# Patient Record
Sex: Male | Born: 1970 | Race: White | Hispanic: No | Smoking: Current every day smoker
Health system: Southern US, Community
[De-identification: ages and names within clinical notes are randomized; demographics above are authoritative.]

## PROBLEM LIST (undated history)

## (undated) DIAGNOSIS — D649 Anemia, unspecified: Secondary | ICD-10-CM

## (undated) DIAGNOSIS — M549 Dorsalgia, unspecified: Secondary | ICD-10-CM

## (undated) DIAGNOSIS — R569 Unspecified convulsions: Secondary | ICD-10-CM

## (undated) DIAGNOSIS — G8929 Other chronic pain: Secondary | ICD-10-CM

## (undated) DIAGNOSIS — I10 Essential (primary) hypertension: Secondary | ICD-10-CM

## (undated) DIAGNOSIS — K7460 Unspecified cirrhosis of liver: Secondary | ICD-10-CM

## (undated) HISTORY — PX: NO PAST SURGERIES: SHX2092

---

## 2005-07-22 ENCOUNTER — Emergency Department: Payer: Self-pay | Admitting: Unknown Physician Specialty

## 2005-08-24 ENCOUNTER — Emergency Department: Payer: Self-pay | Admitting: Emergency Medicine

## 2009-06-13 ENCOUNTER — Emergency Department: Payer: Self-pay | Admitting: Emergency Medicine

## 2010-10-31 ENCOUNTER — Emergency Department: Payer: Self-pay | Admitting: Unknown Physician Specialty

## 2010-11-27 ENCOUNTER — Inpatient Hospital Stay: Payer: Self-pay | Admitting: Psychiatry

## 2011-02-16 ENCOUNTER — Emergency Department: Payer: Self-pay | Admitting: Emergency Medicine

## 2011-11-01 ENCOUNTER — Emergency Department: Payer: Self-pay | Admitting: *Deleted

## 2011-12-02 ENCOUNTER — Emergency Department: Payer: Self-pay | Admitting: Emergency Medicine

## 2012-08-08 ENCOUNTER — Emergency Department: Payer: Self-pay | Admitting: Emergency Medicine

## 2012-08-08 LAB — CBC
HCT: 43.4 % (ref 40.0–52.0)
HGB: 14.9 g/dL (ref 13.0–18.0)
MCH: 34.3 pg — ABNORMAL HIGH (ref 26.0–34.0)
MCHC: 34.3 g/dL (ref 32.0–36.0)
MCV: 100 fL (ref 80–100)
RDW: 14.1 % (ref 11.5–14.5)

## 2012-08-08 LAB — COMPREHENSIVE METABOLIC PANEL
BUN: 6 mg/dL — ABNORMAL LOW (ref 7–18)
Chloride: 101 mmol/L (ref 98–107)
EGFR (African American): >60
EGFR (Non-African Amer.): >60
SGOT(AST): 203 U/L — ABNORMAL HIGH (ref 15–37)
SGPT (ALT): 106 U/L — ABNORMAL HIGH (ref 12–78)
Total Protein: 8.2 g/dL (ref 6.4–8.2)

## 2012-08-08 LAB — ETHANOL: Ethanol: <3 mg/dL

## 2012-08-08 LAB — MAGNESIUM: Magnesium: 1.5 mg/dL — ABNORMAL LOW

## 2013-02-03 ENCOUNTER — Encounter (HOSPITAL_COMMUNITY): Payer: Self-pay

## 2013-02-03 ENCOUNTER — Emergency Department: Payer: Self-pay | Admitting: Emergency Medicine

## 2013-02-03 ENCOUNTER — Inpatient Hospital Stay (HOSPITAL_COMMUNITY)
Admission: AD | Admit: 2013-02-03 | Discharge: 2013-02-08 | DRG: 086 | Disposition: A | Discharge status: Home / Self Care | Payer: Medicaid Other | Source: Other Acute Inpatient Hospital | Attending: Neurosurgery | Admitting: Neurosurgery

## 2013-02-03 DIAGNOSIS — G40909 Epilepsy, unspecified, not intractable, without status epilepticus: Secondary | ICD-10-CM | POA: Diagnosis present

## 2013-02-03 DIAGNOSIS — W19XXXA Unspecified fall, initial encounter: Secondary | ICD-10-CM | POA: Diagnosis present

## 2013-02-03 DIAGNOSIS — S06330A Contusion and laceration of cerebrum, unspecified, without loss of consciousness, initial encounter: Principal | ICD-10-CM | POA: Diagnosis present

## 2013-02-03 DIAGNOSIS — Y9229 Other specified public building as the place of occurrence of the external cause: Secondary | ICD-10-CM

## 2013-02-03 DIAGNOSIS — S065X9A Traumatic subdural hemorrhage with loss of consciousness of unspecified duration, initial encounter: Secondary | ICD-10-CM

## 2013-02-03 DIAGNOSIS — F17200 Nicotine dependence, unspecified, uncomplicated: Secondary | ICD-10-CM | POA: Diagnosis present

## 2013-02-03 DIAGNOSIS — R4789 Other speech disturbances: Secondary | ICD-10-CM | POA: Diagnosis present

## 2013-02-03 DIAGNOSIS — I10 Essential (primary) hypertension: Secondary | ICD-10-CM | POA: Diagnosis present

## 2013-02-03 DIAGNOSIS — E876 Hypokalemia: Secondary | ICD-10-CM | POA: Diagnosis not present

## 2013-02-03 DIAGNOSIS — E236 Other disorders of pituitary gland: Secondary | ICD-10-CM | POA: Diagnosis not present

## 2013-02-03 DIAGNOSIS — E871 Hypo-osmolality and hyponatremia: Secondary | ICD-10-CM

## 2013-02-03 HISTORY — DX: Essential (primary) hypertension: I10

## 2013-02-03 HISTORY — DX: Unspecified convulsions: R56.9

## 2013-02-03 LAB — CBC WITH DIFFERENTIAL/PLATELET
Basophil #: 0.2 10*3/uL — ABNORMAL HIGH (ref 0.0–0.1)
Eosinophil #: 0 10*3/uL (ref 0.0–0.7)
Lymphocyte #: 0.5 10*3/uL — ABNORMAL LOW (ref 1.0–3.6)
Lymphocyte %: 7.2 %
MCV: 96 fL (ref 80–100)
Monocyte #: 1 x10 3/mm (ref 0.2–1.0)
Platelet: 176 10*3/uL (ref 150–440)
RBC: 4.31 10*6/uL — ABNORMAL LOW (ref 4.40–5.90)
WBC: 7.4 10*3/uL (ref 3.8–10.6)

## 2013-02-03 LAB — BASIC METABOLIC PANEL
Anion Gap: 11 (ref 7–16)
Creatinine: 0.85 mg/dL (ref 0.60–1.30)
EGFR (African American): >60
Glucose: 133 mg/dL — ABNORMAL HIGH (ref 65–99)
Sodium: 124 mmol/L — ABNORMAL LOW (ref 136–145)

## 2013-02-03 MED ORDER — ONDANSETRON HCL 4 MG/2ML IJ SOLN
4.0000 mg | Freq: Four times a day (QID) | INTRAMUSCULAR | Status: DC | PRN
  Administered 2013-02-03: 4 mg via INTRAVENOUS
  Filled 2013-02-03: qty 2

## 2013-02-03 MED ORDER — POTASSIUM CHLORIDE CRYS ER 20 MEQ PO TBCR
40.0000 meq | EXTENDED_RELEASE_TABLET | Freq: Two times a day (BID) | ORAL | Status: DC
  Administered 2013-02-04: 40 meq via ORAL
  Filled 2013-02-03 (×2): qty 2

## 2013-02-03 MED ORDER — LABETALOL HCL 5 MG/ML IV SOLN: 10.0000 mg | INTRAVENOUS | Status: DC | PRN

## 2013-02-03 MED ORDER — POTASSIUM CHLORIDE 10 MEQ/100ML IV SOLN
10.0000 meq | INTRAVENOUS | Status: AC
  Administered 2013-02-03 (×6): 10 meq via INTRAVENOUS
  Filled 2013-02-03 (×5): qty 100

## 2013-02-03 MED ORDER — KCL IN DEXTROSE-NACL 40-5-0.9 MEQ/L-%-% IV SOLN
INTRAVENOUS | Status: DC
  Administered 2013-02-03 – 2013-02-04 (×2): via INTRAVENOUS
  Filled 2013-02-03 (×3): qty 1000

## 2013-02-03 MED ORDER — OXYCODONE-ACETAMINOPHEN 5-325 MG PO TABS
1.0000 | ORAL_TABLET | ORAL | Status: DC | PRN
  Administered 2013-02-04 (×4): 2 via ORAL
  Administered 2013-02-05: 1 via ORAL
  Administered 2013-02-06 – 2013-02-08 (×8): 2 via ORAL
  Filled 2013-02-03 (×3): qty 2
  Filled 2013-02-03: qty 1
  Filled 2013-02-03 (×10): qty 2

## 2013-02-03 MED ORDER — MORPHINE SULFATE 2 MG/ML IJ SOLN
2.0000 mg | INTRAMUSCULAR | Status: DC | PRN
  Administered 2013-02-03 – 2013-02-06 (×4): 2 mg via INTRAVENOUS
  Filled 2013-02-03 (×4): qty 1

## 2013-02-03 NOTE — Progress Notes (Signed)
Pt admitted, transferred by [facility] from [facility] ER. On 2L [place], VS stable so oxygen removed and pt on room air, sats >96%. Dr. Lovell Sheehan notified of arrival to unit. Pt alert, c/o HA, MD aware. Pt is able to follow simple commands (stick out your tongue) but not complex commands (raise two fingers). Pt has no drift, pupils equal, reactive to light. Oriented to unit, pt has poor memory and asks repetitive questions with problems finding words and at times switching words about ("do you smoke?" - "I drink 3 packs a day"). E Link notified of admission. No family present, but mother and wife are on the way per [facility] transporters. Educated about call bell use and demonstrated ability to call RN for help. Urinal within reach, bed alarm on.    Delynn Flavin, RN, BSN

## 2013-02-03 NOTE — H&P (Addendum)
Subjective: The patient is a 42 year old white male who has a history of a seizure disorder and is on Keppra. By report this afternoon the patient was at the bank and had a seizure. He fell and struck his head. He was taken to [facility] where was evaluated by the emergency room physician. The evaluation included a head CT which demonstrated a left-sided subdural hematoma. The ER Dr. requested that the patient be transferred to [facility] for further neurosurgical management.  Presently the patient has no family members around. By report his wife is on the way. The patient has an expressive dysphasia which makes getting an accurate history from him difficult. He complains of a headache. He denies neck pain. He admits to a bit of numbness in his left hand. Past Medical History  Diagnosis Date  . Hypertension   . Seizures     No past surgical history on file.  No Known Allergies  History  Substance Use Topics  . Smoking status: Current Every Day Smoker -- 0.25 packs/day for 25 years    Types: Cigarettes  . Smokeless tobacco: Not on file  . Alcohol Use: 2.4 oz/week    4 Cans of beer per week    No family history on file. Prior to Admission medications   Not on File     Review of Systems  Positive ROS: Negative except as above with limitations secondary to his dysphasia  All other systems have been reviewed and were otherwise negative with the exception of those mentioned in the HPI and as above.  Objective: Vital signs in last 24 hours: Temp:  [98.6 F (37 C)] 98.6 F (37 C) (02/21 1700) Pulse Rate:  [97-110] 99 (02/21 1830) Resp:  [12-19] 19 (02/21 1830) BP: (146-161)/(92-101) 154/93 mmHg (02/21 1830) SpO2:  [96 %-100 %] 97 % (02/21 1830) Weight:  [71 kg (156 lb 8.4 oz)] 71 kg (156 lb 8.4 oz) (02/21 1700)  Physical exam: Gen.: An alert and pleasant 42 year old white male in no apparent stress.  HEENT: Normocephalic, he has some tenderness to  palpation of his left scalp no obvious deformities. His pupils are equal round reactive light. Extraocular muscles are intact. There is no battle signs, raccoon's eyes, etc. I do not see any evidence of CSF otorrhea or rhinorrhea.  Neck: Supple without masses, deformities, tracheal deviation. He has a normal cervical range of motion. Spurling's testing is negative. Lhermitt some was not present.  Thorax: Symmetric  Abdomen: Soft  Extremities: No obvious deformities  Back exam: Unremarkable    NEUROLOGIC:   Mental status: The patient is alert and pleasant. Glasgow Coma Scale 13 (E4, M6, V3) the patient is oriented to person only. He has an expressive dysphasia Motor Exam - grossly normal in his bilateral biceps triceps and grip quadriceps gastrocnemius Sensory Exam - grossly normal except he may see some numbness in his left hand. Reflexes: Not tested Coordination - grossly normal to rapid alternating movements of the upper extremities bilaterally Gait -not tested Balance -not tested Cranial Nerves: Cranial nerves II through XII were examined bilaterally and grossly normal.  Data Review No results found for this basename: WBC, HGB, HCT, MCV, PLT   No results found for this basename: NA, K, CL, CO2, BUN, creatinine, glucose   No results found for this basename: INR, PROTIME   Imaging studies: I reviewed the patient's head CT performed at [facility] today without contrast via disc. It demonstrates the patient has a  left temporal subdural hematoma which extends laterally and subtemporally. There is no midline shift.  I have also reviewed patient's cervical CT performed without contrast at [facility] today. It demonstrates some spondylosis at C5-6. There is no acute fractures, subluxations, soft tissue swelling etc. Assessment/Plan: Seizure disorder, left subdural hematoma, post ictal state: The patient has an expressive dysphasia. This could  be either secondary to it a seizure, i.e. a post ictal state, or less likely the subdural hematoma as there is no significant mass effect. The patient is to be admitted to the intensive care unit for observation. I will plan to repeat his CAT scan tomorrow. I will gather more information with the patient's wife arrives.   Karyn Brull D 02/03/2013 6:38 PM     Hypokalemia: The patient's potassium reportedly was 2.6 at [facility]. He will be given potassium. We'll plan to repeat his BMP tomorrow.

## 2013-02-04 ENCOUNTER — Encounter (HOSPITAL_COMMUNITY): Payer: Self-pay | Admitting: Neurology

## 2013-02-04 ENCOUNTER — Inpatient Hospital Stay (HOSPITAL_COMMUNITY): Payer: Medicaid Other

## 2013-02-04 DIAGNOSIS — E871 Hypo-osmolality and hyponatremia: Secondary | ICD-10-CM | POA: Diagnosis present

## 2013-02-04 DIAGNOSIS — E876 Hypokalemia: Secondary | ICD-10-CM

## 2013-02-04 DIAGNOSIS — S065XAA Traumatic subdural hemorrhage with loss of consciousness status unknown, initial encounter: Secondary | ICD-10-CM

## 2013-02-04 DIAGNOSIS — I10 Essential (primary) hypertension: Secondary | ICD-10-CM | POA: Diagnosis present

## 2013-02-04 DIAGNOSIS — G40909 Epilepsy, unspecified, not intractable, without status epilepticus: Secondary | ICD-10-CM | POA: Diagnosis present

## 2013-02-04 DIAGNOSIS — S065X9A Traumatic subdural hemorrhage with loss of consciousness of unspecified duration, initial encounter: Secondary | ICD-10-CM

## 2013-02-04 DIAGNOSIS — W19XXXA Unspecified fall, initial encounter: Secondary | ICD-10-CM

## 2013-02-04 LAB — SODIUM: Sodium: 117 mEq/L — CL (ref 135–145)

## 2013-02-04 LAB — BASIC METABOLIC PANEL
BUN: 3 mg/dL — ABNORMAL LOW (ref 6–23)
BUN: 4 mg/dL — ABNORMAL LOW (ref 6–23)
BUN: 4 mg/dL — ABNORMAL LOW (ref 6–23)
CO2: 33 mEq/L — ABNORMAL HIGH (ref 19–32)
CO2: 31 mEq/L (ref 19–32)
Chloride: 79 mEq/L — ABNORMAL LOW (ref 96–112)
Chloride: 77 mEq/L — ABNORMAL LOW (ref 96–112)
Chloride: 75 mEq/L — ABNORMAL LOW (ref 96–112)
Creatinine, Ser: 0.6 mg/dL (ref 0.50–1.35)
Creatinine, Ser: 0.59 mg/dL (ref 0.50–1.35)
GFR calc Af Amer: >90 mL/min (ref >90)
Glucose, Bld: 126 mg/dL — ABNORMAL HIGH (ref 70–99)
Potassium: 2.8 mEq/L — ABNORMAL LOW (ref 3.5–5.1)

## 2013-02-04 MED ORDER — POTASSIUM CHLORIDE 10 MEQ/100ML IV SOLN
INTRAVENOUS | Status: AC
  Administered 2013-02-04: 10 meq
  Filled 2013-02-04: qty 100

## 2013-02-04 MED ORDER — VITAMIN B-1 100 MG PO TABS
100.0000 mg | ORAL_TABLET | Freq: Every day | ORAL | Status: DC
  Administered 2013-02-04: 100 mg via ORAL
  Filled 2013-02-04: qty 1

## 2013-02-04 MED ORDER — LORAZEPAM 2 MG/ML IJ SOLN
1.0000 mg | Freq: Four times a day (QID) | INTRAMUSCULAR | Status: DC | PRN
  Administered 2013-02-04: 2 mg via INTRAVENOUS
  Filled 2013-02-04: qty 1

## 2013-02-04 MED ORDER — LORAZEPAM 2 MG/ML IJ SOLN
4.0000 mg | Freq: Once | INTRAMUSCULAR | Status: AC
  Administered 2013-02-04: 4 mg via INTRAVENOUS

## 2013-02-04 MED ORDER — LORAZEPAM 2 MG/ML IJ SOLN
1.0000 mg | INTRAMUSCULAR | Status: DC | PRN
  Administered 2013-02-04: 2 mg via INTRAVENOUS
  Filled 2013-02-04: qty 1
  Filled 2013-02-04: qty 2

## 2013-02-04 MED ORDER — HALOPERIDOL LACTATE 5 MG/ML IJ SOLN
INTRAMUSCULAR | Status: AC
  Administered 2013-02-04: 5 mg
  Filled 2013-02-04: qty 1

## 2013-02-04 MED ORDER — POTASSIUM CHLORIDE 10 MEQ/100ML IV SOLN
10.0000 meq | INTRAVENOUS | Status: AC
  Administered 2013-02-04 (×5): 10 meq via INTRAVENOUS
  Filled 2013-02-04: qty 500

## 2013-02-04 MED ORDER — POTASSIUM CHLORIDE CRYS ER 20 MEQ PO TBCR
40.0000 meq | EXTENDED_RELEASE_TABLET | Freq: Three times a day (TID) | ORAL | Status: DC
  Administered 2013-02-04: 40 meq via ORAL
  Filled 2013-02-04 (×3): qty 2

## 2013-02-04 MED ORDER — LEVETIRACETAM 750 MG PO TABS
750.0000 mg | ORAL_TABLET | Freq: Two times a day (BID) | ORAL | Status: DC
  Administered 2013-02-04 – 2013-02-07 (×7): 750 mg via ORAL
  Filled 2013-02-04 (×14): qty 1

## 2013-02-04 MED ORDER — FOLIC ACID 1 MG PO TABS
1.0000 mg | ORAL_TABLET | Freq: Every day | ORAL | Status: DC
  Administered 2013-02-04: 1 mg via ORAL
  Filled 2013-02-04: qty 1

## 2013-02-04 MED ORDER — THIAMINE HCL 100 MG/ML IJ SOLN
100.0000 mg | Freq: Every day | INTRAMUSCULAR | Status: DC
  Administered 2013-02-04 – 2013-02-06 (×3): 100 mg via INTRAVENOUS
  Filled 2013-02-04 (×4): qty 1

## 2013-02-04 MED ORDER — ADULT MULTIVITAMIN W/MINERALS CH
1.0000 | ORAL_TABLET | Freq: Every day | ORAL | Status: DC
  Administered 2013-02-04: 1 via ORAL
  Filled 2013-02-04: qty 1

## 2013-02-04 MED ORDER — LORAZEPAM 2 MG/ML IJ SOLN
INTRAMUSCULAR | Status: AC
  Filled 2013-02-04: qty 2

## 2013-02-04 MED ORDER — FOLIC ACID 5 MG/ML IJ SOLN
1.0000 mg | Freq: Every day | INTRAMUSCULAR | Status: DC
  Administered 2013-02-05 – 2013-02-06 (×2): 1 mg via INTRAVENOUS
  Filled 2013-02-04 (×3): qty 0.2

## 2013-02-04 MED ORDER — THIAMINE HCL 100 MG/ML IJ SOLN
100.0000 mg | Freq: Every day | INTRAMUSCULAR | Status: DC
  Filled 2013-02-04: qty 1

## 2013-02-04 MED ORDER — LORAZEPAM 1 MG PO TABS
1.0000 mg | ORAL_TABLET | Freq: Four times a day (QID) | ORAL | Status: DC | PRN
  Administered 2013-02-04: 1 mg via ORAL
  Filled 2013-02-04: qty 1

## 2013-02-04 MED ORDER — DEXMEDETOMIDINE HCL IN NACL 200 MCG/50ML IV SOLN
0.2000 ug/kg/h | INTRAVENOUS | Status: DC
  Administered 2013-02-04: 0.7 ug/kg/h via INTRAVENOUS
  Administered 2013-02-05: 0.5 ug/kg/h via INTRAVENOUS
  Administered 2013-02-05: 0.6 ug/kg/h via INTRAVENOUS
  Administered 2013-02-05: 0.4 ug/kg/h via INTRAVENOUS
  Administered 2013-02-05: 0.6 ug/kg/h via INTRAVENOUS
  Administered 2013-02-05: 0.5 ug/kg/h via INTRAVENOUS
  Filled 2013-02-04 (×6): qty 50

## 2013-02-04 MED ORDER — HALOPERIDOL LACTATE 5 MG/ML IJ SOLN
5.0000 mg | Freq: Once | INTRAMUSCULAR | Status: AC
  Administered 2013-02-04: 5 mg via INTRAVENOUS

## 2013-02-04 NOTE — Consult Note (Signed)
Name: Seth [name] MRN: [id number] DOB: 1971-09-07    LOS: 1  PULMONARY / CRITICAL CARE MEDICINE  HPI:  Seth [name] is a 42 year old gentleman with a history of seizure disorder on Keppra. Yesterday he was at the bank when he had a seizure.  He fell and struck his head.  He had a head CT which demonstrated a left-sided subdural hematoma. He was admitted to [facility] for further evaluation.  Over the course of the last 24 hours his sodium has gradually dropped from 128 to 116.  His potassium has decreased from 2.9 down to 2.5. We are consulted for further evaluation of his hyponatremia and hypokalemia. He has a history of hypertension, for which he is on amlodipine and chlorthalidone at home. Currently he is quite agitated and confused, and further history from the patient is difficult.  Past Medical History  Diagnosis Date  . Hypertension   . Seizures    History reviewed. No pertinent past surgical history. Prior to Admission medications   Medication Sig Start Date End Date Taking? Authorizing Provider  amLODipine (NORVASC) 5 MG tablet Take 5 mg by mouth daily.   Yes Historical Provider, MD  chlorthalidone (HYGROTON) 25 MG tablet Take 25 mg by mouth daily.   Yes Historical Provider, MD  diazepam (VALIUM) 5 MG tablet Take 5 mg by mouth every 6 (six) hours as needed for anxiety.   Yes Historical Provider, MD  levETIRAcetam (KEPPRA) 1000 MG tablet Take 1,000 mg by mouth 2 (two) times daily.   Yes Historical Provider, MD  omeprazole (PRILOSEC) 20 MG capsule Take 20 mg by mouth daily.   Yes Historical Provider, MD   Allergies No Known Allergies  Family History History reviewed. No pertinent family history. Social History  reports that he has been smoking Cigarettes.  He has a 6.25 pack-year smoking history. He does not have any smokeless tobacco history on file. He reports that he drinks about 2.4 ounces of alcohol per week. He reports that he does not use illicit drugs.  Review Of  Systems:  Unable to provide.  Brief patient description:  42 year old with subdural hematoma, history of hypertension, and new hyponatremia and hypokalemia.  Events Since Admission: Has been on D5 normal saline with 40 of K. at 75 an hour since admission.  Has received 12 K. riders and 120 mEq of by mouth potassium.  Current Status:  Vital Signs: Temp:  [98.7 F (37.1 C)-100 F (37.8 C)] 99.9 F (37.7 C) (02/22 1917) Pulse Rate:  [92-128] 104 (02/22 1511) Resp:  [10-18] 17 (02/22 1800) BP: (132-161)/(52-121) 132/87 mmHg (02/22 1800) SpO2:  [92 %-98 %] 95 % (02/22 1600)  Physical Examination: General: No apparent distress. Eyes: Anicteric sclerae. ENT: Oropharynx clear. Moist mucous membranes. No thrush Lymph: No cervical, supraclavicular, or axillary lymphadenopathy. Heart: Normal S1, S2. No murmurs, rubs, or gallops appreciated. No bruits, equal pulses. Lungs: Normal excursion, no dullness to percussion. Good air movement bilaterally, without wheezes or crackles. Normal upper airway sounds without evidence of stridor. Abdomen: Abdomen soft, non-tender and not distended, normoactive bowel sounds. No hepatosplenomegaly or masses. Musculoskeletal: No clubbing or synovitis. Skin: No rashes or lesions Neuro: Agitated  I have reviewed the labs and personally reviewed the radiology imaging since admission.  Results for orders placed during the hospital encounter of 02/03/13 (from the past 24 hour(s))  BASIC METABOLIC PANEL     Status: Abnormal   Collection Time    02/04/13  4:40 AM  Result Value Range   Sodium 124 (*) 135 - 145 mEq/L   Potassium 2.9 (*) 3.5 - 5.1 mEq/L   Chloride 79 (*) 96 - 112 mEq/L   CO2 33 (*) 19 - 32 mEq/L   Glucose, Bld 144 (*) 70 - 99 mg/dL   BUN 3 (*) 6 - 23 mg/dL   Creatinine, Ser 1.61  0.50 - 1.35 mg/dL   Calcium 9.7  8.4 - 09.6 mg/dL   GFR calc non Af Amer >90  >90 mL/min   GFR calc Af Amer >90  >90 mL/min  BASIC METABOLIC PANEL     Status:  Abnormal   Collection Time    02/04/13  4:55 PM      Result Value Range   Sodium 118 (*) 135 - 145 mEq/L   Potassium 2.8 (*) 3.5 - 5.1 mEq/L   Chloride 77 (*) 96 - 112 mEq/L   CO2 32  19 - 32 mEq/L   Glucose, Bld 126 (*) 70 - 99 mg/dL   BUN 4 (*) 6 - 23 mg/dL   Creatinine, Ser 0.45  0.50 - 1.35 mg/dL   Calcium 9.2  8.4 - 40.9 mg/dL   GFR calc non Af Amer >90  >90 mL/min   GFR calc Af Amer >90  >90 mL/min  BASIC METABOLIC PANEL     Status: Abnormal   Collection Time    02/04/13  6:44 PM      Result Value Range   Sodium 116 (*) 135 - 145 mEq/L   Potassium 2.5 (*) 3.5 - 5.1 mEq/L   Chloride 75 (*) 96 - 112 mEq/L   CO2 31  19 - 32 mEq/L   Glucose, Bld 134 (*) 70 - 99 mg/dL   BUN 4 (*) 6 - 23 mg/dL   Creatinine, Ser 8.11  0.50 - 1.35 mg/dL   Calcium 9.1  8.4 - 91.4 mg/dL   GFR calc non Af Amer >90  >90 mL/min   GFR calc Af Amer >90  >90 mL/min   Principal Problem:   Traumatic intracranial subdural hematoma Active Problems:   Seizure disorder   Hypertension   Hyponatremia   Hypokalemia   ASSESSMENT AND PLAN This potentially reflects a hyperreninemia state, versus SIADH, although the hypokalemia is uncommon and SIADH. I suspect replacement of the hypokalemia may aid in replacing and correcting the hyponatremia. I have stopped his maintenance fluids, have increased his potassium replacement 3 times a day and increased frequency of his electrolyte checks to every 6 hours for the next 2 days. Currently on hold off initiating hypertonic saline for 2 reasons: #1 he is quite agitated and placement of the line would be challenging, #2 he is at high risk to pull the line out as well. If correction of his hypokalemia does not correct his hyponatremia, we could consider salt tabs in the morning in addition to his current fluid restriction. However, I would use as exceedingly sparingly.  Lavella Hammock, M.D. Pulmonary and Critical Care Medicine [facility] Pager: [phone]  02/04/2013, 9:00 PM

## 2013-02-04 NOTE — Progress Notes (Signed)
Critical care E-link notified of agitation ,combative, sweating and fighting, cursing. Patient's CIWA score is 26. Uncontrolled agitation, 5 staff members including security trying to assist patient. New orders received including sedation and restraints.

## 2013-02-04 NOTE — Progress Notes (Signed)
DTs remain agitated   Ativan ordered

## 2013-02-04 NOTE — Progress Notes (Signed)
Patient ID: Seth [name], male   DOB: 04-Aug-1971, 42 y.o.   MRN: [id number] Subjective:  The patient is alert and pleasant. He continues to have and expressive dysphasia. Objective: Vital signs in last 24 hours: Temp:  [98.6 F (37 C)-99.4 F (37.4 C)] 99.4 F (37.4 C) (02/22 0400) Pulse Rate:  [91-110] 100 (02/22 0800) Resp:  [10-19] 15 (02/22 0800) BP: (139-161)/(52-101) 152/89 mmHg (02/22 0800) SpO2:  [92 %-100 %] 93 % (02/22 0800) Weight:  [71 kg (156 lb 8.4 oz)] 71 kg (156 lb 8.4 oz) (02/21 1700)  Intake/Output from previous day: 02/21 0701 - 02/22 0700 In: 1340 [I.V.:600; IV Piggyback:600] Out: 1210 [Urine:1210] Intake/Output this shift: Total I/O In: 95 [P.O.:20; I.V.:75] Out: -   Physical exam the patient is alert and pleasant. Glasgow Coma Scale  13 without change. He has an expressive dysphasia. His pupils are equal. His strength is normal.  Imaging studies: I reviewed the patient's followup head CT performed at [facility] without contrast today. It demonstrates a left-sided subdural hematoma with left temporal contusions. There is mild midline shift.  Lab Results: No results found for this basename: WBC, HGB, HCT, PLT,  in the last 72 hours BMET  Recent Labs  02/04/13 0440  NA 124*  K 2.9*  CL 79*  CO2 33*  GLUCOSE 144*  BUN 3*  CREATININE 0.60  CALCIUM 9.7    Studies/Results: Ct Head Wo Contrast  02/04/2013  *RADIOLOGY REPORT*  Clinical Data: Follow-up seizure with fall and head trauma.  CT HEAD WITHOUT CONTRAST  Technique:  Contiguous axial images were obtained from the base of the skull through the vertex without contrast.  Comparison: None.  Findings: No skull fractures seen.  No traumatic fluid in the sinuses, middle ears or mastoids.  There is hemorrhagic contusion within the left temporal lobe with a cluster of intraparenchymal hematoma is, the largest measuring 2.4 cm in diameter.  No other intraparenchymal hemorrhage is seen. There is subdural  hematoma along the lateral convexity on the left with maximal thickness of approximately 6 mm.  There is subdural hematoma along the posterior falx, a millimeter or two in thickness only.  There is subdural blood along the tentorium, only a millimeter or two in thickness.  There is a small amount of blood in the peri mesencephalic region on the left which is probably subarachnoid. There is at most one or 2 mm of left right midline shift.  No intraventricular hemorrhage.  No hydrocephalus.  No sign of ischemic infarction or definable mass lesion.  IMPRESSION: Hemorrhagic contusion affecting the left temporal lobe.  Largest intraparenchymal component measures 2.4 cm.  Subdural hematoma along the lateral convexity, maximal thickness approximately 6 mm.  Lesser amounts of subdural blood along the tentorium and posterior falx.  Subarachnoid blood in the perimesencephalic region on the left.   Original Report Authenticated By: Paulina Fusi, M.D.     Assessment/Plan: Seizure disorder, left subdural hematoma, left cerebral contusions, expressive dysphasia: I suspect the patient's dysphasia is largely secondary to cerebral contusions as a subdural hematoma is not large or creating significant mass effect. We will continue with observation.  LOS: 1 day     Amoy Steeves D 02/04/2013, 8:18 AM

## 2013-02-04 NOTE — Progress Notes (Signed)
CRITICAL VALUE ALERT  Critical value received:  Na- 118  Date of notification:  02/04/13  Time of notification:  1807  Critical value read back:yes  Nurse who received alert:  Oda Cogan, RN  MD notified (1st page):  Dr. Lovell Sheehan  Time of first page:  1808  MD notified (2nd page):  Time of second page:  Responding MD:  Dr. Lovell Sheehan  Time MD responded:  [phone]

## 2013-02-04 NOTE — Progress Notes (Signed)
CRITICAL VALUE ALERT  Critical value receivedNa+ 116/ K+ 2.5  Date of notification2/22/2014  Time of notification: 1945  Critical value read back:yes  Nurse who received alert:  Alvester Morin, RN  MD notified (1st page):Jenkins  Time of first page:  1947  MD notified (2nd page):  Time of second page:  Responding MD: Lovell Sheehan  Time MD responded:  1950

## 2013-02-04 NOTE — Progress Notes (Signed)
Requested repeat BMET , after 6 runs of KCL  Potassium level reported to be 2.9 and sodium 124. Questionable levels, pt awake and alert ,  ).9 % sodium infusing at 75 ml/hr.

## 2013-02-04 NOTE — Progress Notes (Signed)
eLink Physician-Brief Progress Note Patient Name: Seth [name] DOB: 18-Aug-1971 MRN: [id number]  Date of Service  02/04/2013   HPI/Events of Note   DT, failed Versed  eICU Interventions   Precedex gtt ordered   Intervention Category Major Interventions: Delirium, psychosis, severe agitation - evaluation and management  Zahriyah Joo 02/04/2013, 10:36 PM

## 2013-02-05 DIAGNOSIS — E871 Hypo-osmolality and hyponatremia: Secondary | ICD-10-CM

## 2013-02-05 DIAGNOSIS — S065X9A Traumatic subdural hemorrhage with loss of consciousness of unspecified duration, initial encounter: Secondary | ICD-10-CM

## 2013-02-05 DIAGNOSIS — G40909 Epilepsy, unspecified, not intractable, without status epilepticus: Secondary | ICD-10-CM

## 2013-02-05 LAB — POTASSIUM
Potassium: 2.6 mEq/L — CL (ref 3.5–5.1)
Potassium: 3.2 mEq/L — ABNORMAL LOW (ref 3.5–5.1)

## 2013-02-05 LAB — MAGNESIUM: Magnesium: 1.9 mg/dL (ref 1.5–2.5)

## 2013-02-05 LAB — SODIUM
Sodium: 120 mEq/L — ABNORMAL LOW (ref 135–145)
Sodium: 122 mEq/L — ABNORMAL LOW (ref 135–145)

## 2013-02-05 MED ORDER — SODIUM POLYSTYRENE SULFONATE 15 GM/60ML PO SUSP: 15.0000 g | Freq: Once | ORAL | Status: DC

## 2013-02-05 MED ORDER — DEXMEDETOMIDINE HCL IN NACL 200 MCG/50ML IV SOLN
0.2000 ug/kg/h | INTRAVENOUS | Status: DC
  Administered 2013-02-06: 0.6 ug/kg/h via INTRAVENOUS
  Administered 2013-02-06: 0.5 ug/kg/h via INTRAVENOUS
  Filled 2013-02-05 (×2): qty 50

## 2013-02-05 MED ORDER — POTASSIUM CHLORIDE 10 MEQ/100ML IV SOLN: 10.0000 meq | INTRAVENOUS | Status: DC

## 2013-02-05 MED ORDER — LORAZEPAM 2 MG/ML IJ SOLN
INTRAMUSCULAR | Status: AC
  Filled 2013-02-05: qty 1

## 2013-02-05 MED ORDER — SODIUM CHLORIDE 0.9 % IV SOLN
INTRAVENOUS | Status: DC
  Administered 2013-02-05 (×2): via INTRAVENOUS

## 2013-02-05 MED ORDER — LORAZEPAM 2 MG/ML IJ SOLN
2.0000 mg | Freq: Once | INTRAMUSCULAR | Status: AC
  Administered 2013-02-05: 2 mg via INTRAVENOUS

## 2013-02-05 MED ORDER — POTASSIUM CHLORIDE CRYS ER 20 MEQ PO TBCR
60.0000 meq | EXTENDED_RELEASE_TABLET | Freq: Three times a day (TID) | ORAL | Status: DC
  Administered 2013-02-05 – 2013-02-06 (×4): 60 meq via ORAL
  Filled 2013-02-05 (×4): qty 3

## 2013-02-05 NOTE — Progress Notes (Signed)
Precedex drip infusing, Ativan and Haldol given as ordered. Patient now calm and sleeping.

## 2013-02-05 NOTE — Progress Notes (Signed)
Agitation   1 time ativan ordered

## 2013-02-05 NOTE — Progress Notes (Signed)
Hypokalemia   K replaced  

## 2013-02-05 NOTE — Progress Notes (Signed)
Patient ID: Seth [name], male   DOB: [birth year], 42 y.o.   MRN: [id number] Subjective:  The patient has been sedated throughout the evening. He is easily arousable. There is no significant change.  Objective: Vital signs in last 24 hours: Temp:  [98.8 F (37.1 C)-99.9 F (37.7 C)] 98.8 F (37.1 C) (02/23 0700) Pulse Rate:  [68-143] 68 (02/23 0700) Resp:  [12-20] 14 (02/23 0700) BP: (89-156)/(57-121) 118/67 mmHg (02/23 0700) SpO2:  [93 %-98 %] 93 % (02/23 0700) Weight:  [71 kg (156 lb 8.4 oz)] 71 kg (156 lb 8.4 oz) (02/23 0255)  Intake/Output from previous day: 02/22 0701 - 02/23 0700 In: 2151.3 [P.O.:1000; I.V.:551.3; IV Piggyback:600] Out: 925 [Urine:925] Intake/Output this shift: Total I/O In: -  Out: 65 [Urine:65]  Physical exam Glasgow Coma Scale 13 without change. He is moving all 4 extremities. His pupils are equal. He continues to have an expressive dysphasia.  Lab Results: No results found for this basename: WBC, HGB, HCT, PLT,  in the last 72 hours BMET  Recent Labs  02/04/13 1655 02/04/13 1844 02/04/13 2126 02/05/13 0421  NA 118* 116* 117* 119*  K 2.8* 2.5* 2.6* 2.6*  CL 77* 75*  --   --   CO2 32 31  --   --   GLUCOSE 126* 134*  --   --   BUN 4* 4*  --   --   CREATININE 0.60 0.59  --   --   CALCIUM 9.2 9.1  --   --     Studies/Results: Ct Head Wo Contrast  02/04/2013  *RADIOLOGY REPORT*  Clinical Data: Follow-up seizure with fall and head trauma.  CT HEAD WITHOUT CONTRAST  Technique:  Contiguous axial images were obtained from the base of the skull through the vertex without contrast.  Comparison: None.  Findings: No skull fractures seen.  No traumatic fluid in the sinuses, middle ears or mastoids.  There is hemorrhagic contusion within the left temporal lobe with a cluster of intraparenchymal hematoma is, the largest measuring 2.4 cm in diameter.  No other intraparenchymal hemorrhage is seen. There is subdural hematoma along the lateral convexity on the left  with maximal thickness of approximately 6 mm.  There is subdural hematoma along the posterior falx, a millimeter or two in thickness only.  There is subdural blood along the tentorium, only a millimeter or two in thickness.  There is a small amount of blood in the peri mesencephalic region on the left which is probably subarachnoid. There is at most one or 2 mm of left right midline shift.  No intraventricular hemorrhage.  No hydrocephalus.  No sign of ischemic infarction or definable mass lesion.  IMPRESSION: Hemorrhagic contusion affecting the left temporal lobe.  Largest intraparenchymal component measures 2.4 cm.  Subdural hematoma along the lateral convexity, maximal thickness approximately 6 mm.  Lesser amounts of subdural blood along the tentorium and posterior falx.  Subarachnoid blood in the perimesencephalic region on the left.   Original Report Authenticated By: Paulina Fusi, M.D.     Assessment/Plan: Left temporal contusion, left subdural hematoma: This was stable on his last scan. Clinically he is without change. I will plan to repeat his CAT scan tomorrow.  Syndrome of inappropriate antidiuretic hormone: I appreciate critical care medicines management of this. His sodium is slightly better today he continues to be hypokalemic.  LOS: 2 days     Kooper Chriswell D 02/05/2013, 8:13 AM

## 2013-02-05 NOTE — Progress Notes (Signed)
Name: Seth [name] MRN: [id number] DOB: [birth year]    LOS: 2  PULMONARY / CRITICAL CARE MEDICINE   Brief patient description:  42 year old with subdural hematoma, history of hypertension, and new hyponatremia and hypokalemia.  Events Since Admission: Continues to receive potassium  Current Status: arousable to stimulation  Vital Signs: Temp:  [98.8 F (37.1 C)-99.9 F (37.7 C)] 98.8 F (37.1 C) (02/23 0700) Pulse Rate:  [68-143] 68 (02/23 0700) Resp:  [12-20] 14 (02/23 0700) BP: (89-156)/(57-121) 118/67 mmHg (02/23 0700) SpO2:  [93 %-98 %] 93 % (02/23 0700) Weight:  [71 kg (156 lb 8.4 oz)] 71 kg (156 lb 8.4 oz) (02/23 0255)  Physical Examination: General: No apparent distress. Eyes: Anicteric sclerae. ENT: Oropharynx clear. Moist mucous membranes. No thrush Lymph: No cervical, supraclavicular, or axillary lymphadenopathy. Heart: Normal S1, S2. No murmurs, rubs, or gallops appreciated. No bruits, equal pulses. Lungs: Normal excursion, no dullness to percussion. Good air movement bilaterally, without wheezes or crackles. Normal upper airway sounds without evidence of stridor. Abdomen: Abdomen soft, non-tender and not distended, normoactive bowel sounds. No hepatosplenomegaly or masses. Musculoskeletal: No clubbing or synovitis. Neuro: drowsy, will arouse  Skin: No rashes or lesions Neuro: Agitated  I have reviewed the labs and personally reviewed the radiology imaging since admission.  Results for orders placed during the hospital encounter of 02/03/13 (from the past 24 hour(s))  BASIC METABOLIC PANEL     Status: Abnormal   Collection Time    02/04/13  4:55 PM      Result Value Range   Sodium 118 (*) 135 - 145 mEq/L   Potassium 2.8 (*) 3.5 - 5.1 mEq/L   Chloride 77 (*) 96 - 112 mEq/L   CO2 32  19 - 32 mEq/L   Glucose, Bld 126 (*) 70 - 99 mg/dL   BUN 4 (*) 6 - 23 mg/dL   Creatinine, Ser 3.08  0.50 - 1.35 mg/dL   Calcium 9.2  8.4 - 65.7 mg/dL   GFR calc non Af Amer >90   >90 mL/min   GFR calc Af Amer >90  >90 mL/min  BASIC METABOLIC PANEL     Status: Abnormal   Collection Time    02/04/13  6:44 PM      Result Value Range   Sodium 116 (*) 135 - 145 mEq/L   Potassium 2.5 (*) 3.5 - 5.1 mEq/L   Chloride 75 (*) 96 - 112 mEq/L   CO2 31  19 - 32 mEq/L   Glucose, Bld 134 (*) 70 - 99 mg/dL   BUN 4 (*) 6 - 23 mg/dL   Creatinine, Ser 8.46  0.50 - 1.35 mg/dL   Calcium 9.1  8.4 - 96.2 mg/dL   GFR calc non Af Amer >90  >90 mL/min   GFR calc Af Amer >90  >90 mL/min  SODIUM     Status: Abnormal   Collection Time    02/04/13  9:26 PM      Result Value Range   Sodium 117 (*) 135 - 145 mEq/L  POTASSIUM     Status: Abnormal   Collection Time    02/04/13  9:26 PM      Result Value Range   Potassium 2.6 (*) 3.5 - 5.1 mEq/L  SODIUM     Status: Abnormal   Collection Time    02/05/13  4:21 AM      Result Value Range   Sodium 119 (*) 135 - 145 mEq/L  POTASSIUM     Status: Abnormal  Collection Time    02/05/13  4:21 AM      Result Value Range   Potassium 2.6 (*) 3.5 - 5.1 mEq/L   Principal Problem:   Traumatic intracranial subdural hematoma Active Problems:   Seizure disorder   Hypertension   Hyponatremia   Hypokalemia   ASSESSMENT AND PLAN Pulmonary A:  No acute issues P: Monitor  Cardiovascular A: no acute issues P: monitor  Renal A: ?SIADH vs cerebral salt wasting d/t CNS trauma, Hypokalemia P: check urine lytes, give NACL IVF, K repletion, may need SAMSA or 3% naCl  GI A: no issues P: diet  Neuro A: CNS trauma with bleed P: per Neurosurgery  ID: A: no issues P: monitor  HEME A: no issues P: monitor  Todays summary:   26 ETOHic with head trauma admitted with DTs on precedex drip, ?SIADH with severely low SNa and K.   Plan 0.9% NaCl IVF, po K, may need 3% nacl, may need SAMSA , check urine lytes.   CC74min Dorcas Carrow Beeper  [phone]  Cell  [phone]  If no response or cell goes to voicemail, call beeper  [phone]   Pulmonary and Critical Care Medicine [facility] Pager: [phone]  02/05/2013, 8:33 AM

## 2013-02-06 ENCOUNTER — Encounter (HOSPITAL_COMMUNITY): Payer: Self-pay | Admitting: Radiology

## 2013-02-06 ENCOUNTER — Inpatient Hospital Stay (HOSPITAL_COMMUNITY): Payer: Medicaid Other

## 2013-02-06 DIAGNOSIS — I10 Essential (primary) hypertension: Secondary | ICD-10-CM

## 2013-02-06 DIAGNOSIS — E876 Hypokalemia: Secondary | ICD-10-CM

## 2013-02-06 LAB — BASIC METABOLIC PANEL
Calcium: 9.5 mg/dL (ref 8.4–10.5)
Creatinine, Ser: 0.68 mg/dL (ref 0.50–1.35)
GFR calc non Af Amer: >90 mL/min (ref >90)
Glucose, Bld: 113 mg/dL — ABNORMAL HIGH (ref 70–99)
Sodium: 131 mEq/L — ABNORMAL LOW (ref 135–145)

## 2013-02-06 LAB — SODIUM
Sodium: 121 mEq/L — ABNORMAL LOW (ref 135–145)
Sodium: 129 mEq/L — ABNORMAL LOW (ref 135–145)

## 2013-02-06 LAB — POTASSIUM
Potassium: 3.6 mEq/L (ref 3.5–5.1)
Potassium: 3.9 mEq/L (ref 3.5–5.1)
Potassium: 3.8 mEq/L (ref 3.5–5.1)

## 2013-02-06 MED ORDER — THIAMINE HCL 100 MG/ML IJ SOLN
100.0000 mg | Freq: Every day | INTRAMUSCULAR | Status: DC
  Filled 2013-02-06: qty 1

## 2013-02-06 MED ORDER — ADULT MULTIVITAMIN W/MINERALS CH
1.0000 | ORAL_TABLET | Freq: Every day | ORAL | Status: DC
  Administered 2013-02-06 – 2013-02-08 (×3): 1 via ORAL
  Filled 2013-02-06 (×4): qty 1

## 2013-02-06 MED ORDER — VITAMIN B-1 100 MG PO TABS
100.0000 mg | ORAL_TABLET | Freq: Every day | ORAL | Status: DC
  Administered 2013-02-07 – 2013-02-08 (×2): 100 mg via ORAL
  Filled 2013-02-06 (×2): qty 1

## 2013-02-06 MED ORDER — FOLIC ACID 1 MG PO TABS
1.0000 mg | ORAL_TABLET | Freq: Every day | ORAL | Status: DC
  Administered 2013-02-07 – 2013-02-08 (×2): 1 mg via ORAL
  Filled 2013-02-06 (×2): qty 1

## 2013-02-06 MED ORDER — LORAZEPAM 1 MG PO TABS
1.0000 mg | ORAL_TABLET | Freq: Four times a day (QID) | ORAL | Status: DC | PRN
  Administered 2013-02-06: 1 mg via ORAL
  Filled 2013-02-06: qty 1

## 2013-02-06 MED ORDER — LORAZEPAM 1 MG PO TABS: 0.0000 mg | ORAL_TABLET | Freq: Two times a day (BID) | ORAL | Status: DC

## 2013-02-06 MED ORDER — LORAZEPAM 1 MG PO TABS
0.0000 mg | ORAL_TABLET | Freq: Four times a day (QID) | ORAL | Status: DC
  Administered 2013-02-06 – 2013-02-07 (×3): 1 mg via ORAL
  Filled 2013-02-06 (×3): qty 1

## 2013-02-06 MED ORDER — VITAMIN B-1 100 MG PO TABS
100.0000 mg | ORAL_TABLET | Freq: Every day | ORAL | Status: DC
  Filled 2013-02-06: qty 1

## 2013-02-06 MED ORDER — LORAZEPAM 2 MG/ML IJ SOLN: 1.0000 mg | Freq: Four times a day (QID) | INTRAMUSCULAR | Status: DC | PRN

## 2013-02-06 MED ORDER — POTASSIUM CHLORIDE CRYS ER 20 MEQ PO TBCR
20.0000 meq | EXTENDED_RELEASE_TABLET | Freq: Three times a day (TID) | ORAL | Status: DC
  Administered 2013-02-06 – 2013-02-08 (×6): 20 meq via ORAL
  Filled 2013-02-06 (×7): qty 1

## 2013-02-06 NOTE — Progress Notes (Signed)
Name: Seth [name] MRN: [id number] DOB: 23-Dec-1970    LOS: 3  PULMONARY / CRITICAL CARE MEDICINE   Brief patient description:  42 year old with subdural hematoma, history of hypertension, and new hyponatremia and hypokalemia.  Events Since Admission: Continues to receive potassium  Current Status: No distress, more calm off precedex  Vital Signs: Temp:  [97.6 F (36.4 C)-99.8 F (37.7 C)] 99.5 F (37.5 C) (02/24 0700) Pulse Rate:  [25-95] 80 (02/24 1200) Resp:  [10-20] 10 (02/24 1200) BP: (103-149)/(57-123) 140/83 mmHg (02/24 1200) SpO2:  [93 %-97 %] 97 % (02/24 1200) Weight:  [70.1 kg (154 lb 8.7 oz)] 70.1 kg (154 lb 8.7 oz) (02/24 0100)  Physical Examination: General: alert , O x 1, nonfocal Neuro: perr 3 mm HEENT: jvd wnl PULM: CTA CV: s1 s2 rrr NSR GI: soft, bs wnl no r/g Extremities: no edmea   I have reviewed the labs and personally reviewed the radiology imaging since admission.  Results for orders placed during the hospital encounter of 02/03/13 (from the past 24 hour(s))  SODIUM     Status: Abnormal   Collection Time    02/05/13  3:48 PM      Result Value Range   Sodium 122 (*) 135 - 145 mEq/L  POTASSIUM     Status: Abnormal   Collection Time    02/05/13  3:48 PM      Result Value Range   Potassium 3.2 (*) 3.5 - 5.1 mEq/L  SODIUM     Status: Abnormal   Collection Time    02/05/13 11:08 PM      Result Value Range   Sodium 121 (*) 135 - 145 mEq/L  POTASSIUM     Status: None   Collection Time    02/05/13 11:08 PM      Result Value Range   Potassium 3.6  3.5 - 5.1 mEq/L  SODIUM     Status: Abnormal   Collection Time    02/06/13  6:40 AM      Result Value Range   Sodium 129 (*) 135 - 145 mEq/L  POTASSIUM     Status: None   Collection Time    02/06/13  6:40 AM      Result Value Range   Potassium 3.9  3.5 - 5.1 mEq/L  SODIUM     Status: Abnormal   Collection Time    02/06/13  9:45 AM      Result Value Range   Sodium 129 (*) 135 - 145 mEq/L   POTASSIUM     Status: None   Collection Time    02/06/13  9:45 AM      Result Value Range   Potassium 3.8  3.5 - 5.1 mEq/L   Principal Problem:   Traumatic intracranial subdural hematoma Active Problems:   Seizure disorder   Hypertension   Hyponatremia   Hypokalemia   ASSESSMENT AND PLAN Pulmonary A:  At risk ATX P: pcxr  Cardiovascular A: SDH P: remains normotensive, avoid MAP less 70 tele  R Hyponatremia (NOT c/w SIADH, urine osm NOT concentrated and he improves with nacl), r/o hypovolemic hyponatremia P:  Improved with saline, continue this Chem in am with bicarb and full chem Does not appear to be CSW as urine na low side Chem inam   GI A: At risk dysphagia P: diet  Neuro A: CNS trauma with bleed P: per Neurosurgery, can we move him out as off precedex Dc precedex for good Ct reviewed unchanged  ID: A: no  issues P: monitor  HEME A: high risk DVt P: scd Thiamine, folic acid mvi  Todays summary:   42 etoh, ich, low Na c/w hypovolemic, responding to saline, move to sdu if ok by NS, ct repeat unchanged  Mcarthur Rossetti. Tyson Alias, MD, FACP Pgr: [phone] [facility]

## 2013-02-06 NOTE — Progress Notes (Signed)
Patient ID: Seth [name], male   DOB: 1971/10/29, 42 y.o.   MRN: [id number] Subjective:  The patient is alert and pleasant. He looks better today. I spoke with the patient's wife and mother.  Objective: Vital signs in last 24 hours: Temp:  [97.6 F (36.4 C)-99.8 F (37.7 C)] 99.5 F (37.5 C) (02/24 0700) Pulse Rate:  [25-95] 76 (02/24 0900) Resp:  [13-20] 15 (02/24 0900) BP: (103-148)/(57-123) 117/64 mmHg (02/24 0900) SpO2:  [93 %-97 %] 97 % (02/24 0900) Weight:  [70.1 kg (154 lb 8.7 oz)] 70.1 kg (154 lb 8.7 oz) (02/24 0100)  Intake/Output from previous day: 02/23 0701 - 02/24 0700 In: 3482.1 [P.O.:1610; I.V.:1872.1] Out: 2270 [Urine:2270] Intake/Output this shift: Total I/O In: 265.2 [P.O.:100; I.V.:165.2] Out: 550 [Urine:550]  Physical exam the patient is alert and pleasant. He continues to have an expressive dysphasia. He is moving all 4 extremities well.  I have reviewed the patient's head CT performed without contrast at [facility] today. It demonstrates a left temporal contusion, a small subdural versus epidural hematoma, there is no significant midline shift.  Lab Results: No results found for this basename: WBC, HGB, HCT, PLT,  in the last 72 hours BMET  Recent Labs  02/04/13 1655 02/04/13 1844  02/05/13 2308 02/06/13 0640  NA 118* 116*  < > 121* 129*  K 2.8* 2.5*  < > 3.6 3.9  CL 77* 75*  --   --   --   CO2 32 31  --   --   --   GLUCOSE 126* 134*  --   --   --   BUN 4* 4*  --   --   --   CREATININE 0.60 0.59  --   --   --   CALCIUM 9.2 9.1  --   --   --   < > = values in this interval not displayed.  Studies/Results: Ct Head Wo Contrast  02/06/2013  *RADIOLOGY REPORT*  Clinical Data: Follow-up subdural hematoma and cerebral contusion. No acute changes.  CT HEAD WITHOUT CONTRAST  Technique:  Contiguous axial images were obtained from the base of the skull through the vertex without contrast.  Comparison: 02/04/2013  Findings: Focal intraparenchymal  hematomas in the left temporal lobe with surrounding edema.  The inferior hematoma measures about 15 mm diameter.  Superior posterior hematoma measures about 18 x 22 mm diameter. Subdural hematoma along the left temporoparietal region with maximal depth measurement of bowel 5 mm.  Subdural hematoma along the tentorium and posterior falx and extending to the cisterns adjacent to the mid brain.  The appearance is similar to the previous study.  Mild sulci effacement in the temporal parietal region but no midline shift.  No ventricular dilatation or effacement.  Diffuse underlying atrophy and small vessel ischemic change.  IMPRESSION: Stable appearance of intraparenchymal hematomas in the left temporal lobe and subdural hematomas in the left tentorial, parafalcine, para mesencephalic, and parietal subdural regions.   Original Report Authenticated By: Burman Nieves, M.D.     Assessment/Plan: Left subdural hematoma, cerebral contusion: These are stable. They will not require intervention.  Hyponatremia: The patient's sodium is 129 today. He looks much better.  LOS: 3 days     Ryliegh Mcduffey D 02/06/2013, 10:18 AM

## 2013-02-07 LAB — CBC
HCT: 36.2 % — ABNORMAL LOW (ref 39.0–52.0)
Hemoglobin: 12.9 g/dL — ABNORMAL LOW (ref 13.0–17.0)
MCV: 93.8 fL (ref 78.0–100.0)
RBC: 3.86 MIL/uL — ABNORMAL LOW (ref 4.22–5.81)
WBC: 8.6 10*3/uL (ref 4.0–10.5)

## 2013-02-07 LAB — COMPREHENSIVE METABOLIC PANEL
Albumin: 3.5 g/dL (ref 3.5–5.2)
Alkaline Phosphatase: 65 U/L (ref 39–117)
BUN: 6 mg/dL (ref 6–23)
Calcium: 9.8 mg/dL (ref 8.4–10.5)
Creatinine, Ser: 0.73 mg/dL (ref 0.50–1.35)
GFR calc Af Amer: >90 mL/min (ref >90)
Potassium: 3.6 mEq/L (ref 3.5–5.1)
Total Protein: 7.1 g/dL (ref 6.0–8.3)

## 2013-02-07 MED ORDER — PANTOPRAZOLE SODIUM 40 MG PO TBEC
40.0000 mg | DELAYED_RELEASE_TABLET | Freq: Every day | ORAL | Status: DC
  Administered 2013-02-07 – 2013-02-08 (×2): 40 mg via ORAL
  Filled 2013-02-07 (×2): qty 1

## 2013-02-07 MED ORDER — AMLODIPINE BESYLATE 5 MG PO TABS
5.0000 mg | ORAL_TABLET | Freq: Every day | ORAL | Status: DC
  Administered 2013-02-07 – 2013-02-08 (×2): 5 mg via ORAL
  Filled 2013-02-07 (×2): qty 1

## 2013-02-07 MED ORDER — CHLORTHALIDONE 25 MG PO TABS
25.0000 mg | ORAL_TABLET | Freq: Every day | ORAL | Status: DC
  Administered 2013-02-07 – 2013-02-08 (×2): 25 mg via ORAL
  Filled 2013-02-07 (×2): qty 1

## 2013-02-07 MED ORDER — LEVETIRACETAM 500 MG PO TABS
1000.0000 mg | ORAL_TABLET | Freq: Two times a day (BID) | ORAL | Status: DC
  Administered 2013-02-07 – 2013-02-08 (×2): 1000 mg via ORAL
  Filled 2013-02-07 (×3): qty 2

## 2013-02-07 MED ORDER — DIAZEPAM 5 MG PO TABS: 5.0000 mg | ORAL_TABLET | Freq: Four times a day (QID) | ORAL | Status: DC | PRN

## 2013-02-07 NOTE — Progress Notes (Signed)
eLink Physician-Brief Progress Note Patient Name: Seth [name] DOB: [birth year] MRN: [id number]  Date of Service  02/07/2013   HPI/Events of Note   Patient with hyponatremia on fluid restriction.  eICU Interventions  Order for HL and IVF order d/ced   Intervention Category Intermediate Interventions: Electrolyte abnormality - evaluation and management  DETERDING,ELIZABETH 02/07/2013, 12:08 AM

## 2013-02-07 NOTE — Progress Notes (Signed)
Paged and spoke with Dr. Darrick Penna of [facility] regarding pt's conflicting fluid restriction and fluid order. Fluid order cancelled. Also inquired about BP parameters regarding PRN Labetalol order due to pt's SBP in 160s. Per MD, no anti-hypertensive PRNs should be administered until SBP>170. Will continue to monitor and assess.

## 2013-02-07 NOTE — Progress Notes (Signed)
Report called to receiving RN on  4 north. VSS. Transferred to bed 29 via wheelchair with personal belongings. Called patient's family member with new room number.  [facility]

## 2013-02-07 NOTE — Progress Notes (Signed)
Utilization review completed.  

## 2013-02-07 NOTE — Progress Notes (Signed)
Patient ID: Seth [name], male   DOB: 05-Nov-1971, 42 y.o.   MRN: [id number] Subjective:  The patient is alert and pleasant. His speech is better. He looks well.  Objective: Vital signs in last 24 hours: Temp:  [97.9 F (36.6 C)-98.7 F (37.1 C)] 98.5 F (36.9 C) (02/25 0708) Pulse Rate:  [69-104] 93 (02/25 0545) Resp:  [9-20] 11 (02/25 0545) BP: (115-164)/(64-103) 163/100 mmHg (02/25 0545) SpO2:  [96 %-99 %] 99 % (02/25 0545) Weight:  [72.2 kg (159 lb 2.8 oz)] 72.2 kg (159 lb 2.8 oz) (02/25 0500)  Intake/Output from previous day: 02/24 0701 - 02/25 0700 In: 1185.2 [P.O.:720; I.V.:465.2] Out: 2475 [Urine:2475] Intake/Output this shift:    Physical exam the patient is alert and oriented x2. His expressive dysphagia has improved. His strength is normal. His pupils are equal.  Lab Results:  Recent Labs  02/07/13 0430  WBC 8.6  HGB 12.9*  HCT 36.2*  PLT 215   BMET  Recent Labs  02/06/13 1942 02/07/13 0430  NA 131* 131*  K 4.1 3.6  CL 94* 92*  CO2 25 29  GLUCOSE 113* 96  BUN 7 6  CREATININE 0.68 0.73  CALCIUM 9.5 9.8    Studies/Results: Ct Head Wo Contrast  02/06/2013  *RADIOLOGY REPORT*  Clinical Data: Follow-up subdural hematoma and cerebral contusion. No acute changes.  CT HEAD WITHOUT CONTRAST  Technique:  Contiguous axial images were obtained from the base of the skull through the vertex without contrast.  Comparison: 02/04/2013  Findings: Focal intraparenchymal hematomas in the left temporal lobe with surrounding edema.  The inferior hematoma measures about 15 mm diameter.  Superior posterior hematoma measures about 18 x 22 mm diameter. Subdural hematoma along the left temporoparietal region with maximal depth measurement of bowel 5 mm.  Subdural hematoma along the tentorium and posterior falx and extending to the cisterns adjacent to the mid brain.  The appearance is similar to the previous study.  Mild sulci effacement in the temporal parietal region but no  midline shift.  No ventricular dilatation or effacement.  Diffuse underlying atrophy and small vessel ischemic change.  IMPRESSION: Stable appearance of intraparenchymal hematomas in the left temporal lobe and subdural hematomas in the left tentorial, parafalcine, para mesencephalic, and parietal subdural regions.   Original Report Authenticated By: Burman Nieves, M.D.    Dg Chest Port 1 View  02/06/2013  *RADIOLOGY REPORT*  Clinical Data: Weakness question atelectasis  PORTABLE CHEST - 1 VIEW  Comparison: Portable exam 1311 hours without priors for comparison  Findings: Question minimal enlargement of cardiac silhouette. Mediastinal contours and pulmonary vascularity otherwise normal. Lungs clear. No pleural effusion or pneumothorax. Bones unremarkable.  IMPRESSION: Question minimal enlargement of cardiac silhouette. Lungs clear.   Original Report Authenticated By: Ulyses Southward, M.D.     Assessment/Plan: Left cerebral contusion, subdural hematoma, hyponatremia: The patient is doing better. I will transfer him to the floor. I will plan to repeat his sodium tomorrow if it is stable he will be discharged.  LOS: 4 days     Simone Tuckey D 02/07/2013, 7:52 AM

## 2013-02-07 NOTE — Progress Notes (Signed)
On initial assessment pt CIWA=9. Elink MD called and new orders for ativan received. Will continue to monitor.

## 2013-02-08 LAB — BASIC METABOLIC PANEL
BUN: 7 mg/dL (ref 6–23)
CO2: 28 mEq/L (ref 19–32)
Chloride: 90 mEq/L — ABNORMAL LOW (ref 96–112)
Creatinine, Ser: 0.77 mg/dL (ref 0.50–1.35)
Glucose, Bld: 108 mg/dL — ABNORMAL HIGH (ref 70–99)
Potassium: 3.5 mEq/L (ref 3.5–5.1)

## 2013-02-08 MED ORDER — LEVETIRACETAM 1000 MG PO TABS: 1000.0000 mg | ORAL_TABLET | Freq: Two times a day (BID) | ORAL | Status: DC

## 2013-02-08 MED ORDER — POTASSIUM CHLORIDE CRYS ER 20 MEQ PO TBCR: 20.0000 meq | EXTENDED_RELEASE_TABLET | Freq: Three times a day (TID) | ORAL | Status: DC

## 2013-02-08 MED ORDER — OXYCODONE-ACETAMINOPHEN 5-325 MG PO TABS: 1.0000 | ORAL_TABLET | ORAL | Status: DC | PRN

## 2013-02-08 NOTE — Discharge Summary (Signed)
Physician Discharge Summary  Patient ID: Seth [name] MRN: [id number] DOB/AGE: 1971/09/02 42 y.o.  Admit date: 02/03/2013 Discharge date: 02/08/2013  Admission Diagnoses: Seizures, traumatic subdural hematoma, cerebral contusion, hyponatremia, hypokalemia, expressive dysphasia  Discharge Diagnoses: The same Principal Problem:   Traumatic intracranial subdural hematoma Active Problems:   Seizure disorder   Hypertension   Hyponatremia   Hypokalemia   Discharged Condition: good  Hospital Course: I admitted the patient to [facility]  on 02/03/2013 with a diagnosis of seizures and traumatic cerebral contusions and subdural hematoma.  Patient was initially monitored in the ICU. He has had followup head CTs which were stable. He has had no seizures.  The patient did have significant hyponatremia and hypokalemia. This was treated with fluid restriction.  By 02/08/2013 the patient was neurologically stable. He continued to have a mild expressive dysphasia. He requested discharge to home. He will have 24-hour supervision. I spoke with the patient and his wife. I have instructed them to call the medical doctor in [place] and be seen this week for followup regarding his electrolyte abnormalities. Also instructed them to call the neurologist regarding followup with his seizures. The patient is to followup with me in the next couple weeks. The patient, and his wife, were given oral and written discharge instructions. All her questions were answered.  Consults: Critical care medicine Significant Diagnostic Studies: Head CT  Treatments: Observation Discharge Exam: Blood pressure 145/96, pulse 78, temperature 98.1 F (36.7 C), temperature source Oral, resp. rate 20, height 5\' 7"  (1.702 m), weight 72.2 kg (159 lb 2.8 oz), SpO2 99.00%. The patient is alert and oriented. He has a expressive dysphasia. His pupils are equal. Strength is normal.  Disposition: Home  Discharge Orders   Future Orders Complete By Expires     Call MD for:  difficulty breathing, headache or visual disturbances  As directed     Call MD for:  extreme fatigue  As directed     Call MD for:  hives  As directed     Call MD for:  persistant dizziness or light-headedness  As directed     Call MD for:  persistant nausea and vomiting  As directed     Call MD for:  redness, tenderness, or signs of infection (pain, swelling, redness, odor or green/yellow discharge around incision site)  As directed     Call MD for:  severe uncontrolled pain  As directed     Call MD for:  temperature >100.4  As directed     Diet - low sodium heart healthy  As directed     Discharge instructions  As directed     Comments:      Call [phone] for a followup appointment. Call your medical doctor and [facility] in [facility] for a followup appointment this week regarding your hyponatremia and hypokalemia. Call [facility] neurologist at [facility] regarding your seizure management.    Driving Restrictions  As directed     Comments:      Do not drive .    Increase activity slowly  As directed     Lifting restrictions  As directed     Comments:      Do not lift more than 5 pounds. No excessive bending or twisting.    May shower / Bathe  As directed     Comments:      He may shower after the pain she is removed 3 days after surgery. Leave the incision alone.  Medication List    STOP taking these medications       BC HEADACHE POWDER PO     chlorthalidone 25 MG tablet  Commonly known as:  HYGROTON      TAKE these medications       amLODipine 5 MG tablet  Commonly known as:  NORVASC  Take 5 mg by mouth daily.     diazepam 5 MG tablet  Commonly known as:  VALIUM  Take 5 mg by mouth every 6 (six) hours as needed for anxiety.     levETIRAcetam 1000 MG tablet  Commonly known as:  KEPPRA  Take 1 tablet (1,000 mg total) by mouth 2 (two) times daily.     levETIRAcetam 1000 MG tablet  Commonly known  as:  KEPPRA  Take 1,000 mg by mouth 2 (two) times daily.     omeprazole 20 MG capsule  Commonly known as:  PRILOSEC  Take 20 mg by mouth daily.     oxyCODONE-acetaminophen 5-325 MG per tablet  Commonly known as:  PERCOCET/ROXICET  Take 1-2 tablets by mouth every 4 (four) hours as needed.     potassium chloride SA 20 MEQ tablet  Commonly known as:  K-DUR,KLOR-CON  Take 1 tablet (20 mEq total) by mouth 3 (three) times daily.         SignedCristi Loron 02/08/2013, 11:13 AM

## 2013-08-03 ENCOUNTER — Ambulatory Visit: Payer: Self-pay | Admitting: Neurology

## 2013-08-09 ENCOUNTER — Ambulatory Visit: Payer: Self-pay | Admitting: Neurology

## 2013-09-07 ENCOUNTER — Ambulatory Visit: Payer: Self-pay | Admitting: Neurology

## 2013-09-28 ENCOUNTER — Inpatient Hospital Stay: Payer: Self-pay | Admitting: Internal Medicine

## 2013-09-28 LAB — COMPREHENSIVE METABOLIC PANEL
BUN: 7 mg/dL (ref 7–18)
Bilirubin,Total: 17.4 mg/dL — ABNORMAL HIGH (ref 0.2–1.0)
EGFR (Non-African Amer.): >60
Osmolality: 256 (ref 275–301)
Potassium: 2.7 mmol/L — ABNORMAL LOW (ref 3.5–5.1)
SGOT(AST): 86 U/L — ABNORMAL HIGH (ref 15–37)
SGPT (ALT): 24 U/L (ref 12–78)
Sodium: 128 mmol/L — ABNORMAL LOW (ref 136–145)
Total Protein: 6.2 g/dL — ABNORMAL LOW (ref 6.4–8.2)

## 2013-09-28 LAB — CBC WITH DIFFERENTIAL/PLATELET
Bands: 8 %
HCT: 31.3 % — ABNORMAL LOW (ref 40.0–52.0)
HGB: 10.7 g/dL — ABNORMAL LOW (ref 13.0–18.0)
Lymphocytes: 4 %
MCH: 34.8 pg — ABNORMAL HIGH (ref 26.0–34.0)

## 2013-09-28 LAB — LIPASE, BLOOD: Lipase: 211 U/L (ref 73–393)

## 2013-09-28 LAB — AMMONIA: Ammonia, Plasma: 50 mcmol/L — ABNORMAL HIGH (ref 11–32)

## 2013-09-29 LAB — CBC WITH DIFFERENTIAL/PLATELET
HCT: 31.3 % — ABNORMAL LOW (ref 40.0–52.0)
HGB: 10.9 g/dL — ABNORMAL LOW (ref 13.0–18.0)
MCHC: 34.7 g/dL (ref 32.0–36.0)
Monocytes: 6 %
Platelet: 267 10*3/uL (ref 150–440)
Segmented Neutrophils: 87 %
WBC: 19.3 10*3/uL — ABNORMAL HIGH (ref 3.8–10.6)

## 2013-09-29 LAB — COMPREHENSIVE METABOLIC PANEL
Alkaline Phosphatase: 227 U/L — ABNORMAL HIGH (ref 50–136)
Anion Gap: 7 (ref 7–16)
Calcium, Total: 8.1 mg/dL — ABNORMAL LOW (ref 8.5–10.1)
Co2: 29 mmol/L (ref 21–32)
Creatinine: 0.69 mg/dL (ref 0.60–1.30)
EGFR (African American): >60
Osmolality: 260 (ref 275–301)
SGOT(AST): 89 U/L — ABNORMAL HIGH (ref 15–37)
SGPT (ALT): 24 U/L (ref 12–78)
Sodium: 131 mmol/L — ABNORMAL LOW (ref 136–145)
Total Protein: 6 g/dL — ABNORMAL LOW (ref 6.4–8.2)

## 2013-09-29 LAB — DRUG SCREEN, URINE
Cocaine Metabolite,Ur ~~LOC~~: NEGATIVE (ref ?–300)
MDMA (Ecstasy)Ur Screen: NEGATIVE (ref ?–500)
Phencyclidine (PCP) Ur S: NEGATIVE (ref ?–25)
Tricyclic, Ur Screen: NEGATIVE (ref ?–1000)

## 2013-09-29 LAB — MAGNESIUM: Magnesium: 1.8 mg/dL

## 2013-09-29 LAB — PROTIME-INR
INR: 1.5
Prothrombin Time: 18 secs — ABNORMAL HIGH (ref 11.5–14.7)

## 2013-09-29 LAB — BILIRUBIN, DIRECT: Bilirubin, Direct: 13.6 mg/dL — ABNORMAL HIGH (ref 0.00–0.20)

## 2013-09-29 LAB — LIPID PANEL
Ldl Cholesterol, Calc: 112 mg/dL — ABNORMAL HIGH (ref 0–100)
VLDL Cholesterol, Calc: 46 mg/dL — ABNORMAL HIGH (ref 5–40)

## 2013-09-29 LAB — TSH: Thyroid Stimulating Horm: 3.92 u[IU]/mL

## 2013-09-30 LAB — PROTIME-INR
INR: 1.5
Prothrombin Time: 18.4 secs — ABNORMAL HIGH (ref 11.5–14.7)

## 2013-09-30 LAB — CBC WITH DIFFERENTIAL/PLATELET
HCT: 28.1 % — ABNORMAL LOW (ref 40.0–52.0)
HGB: 9.7 g/dL — ABNORMAL LOW (ref 13.0–18.0)
MCV: 102 fL — ABNORMAL HIGH (ref 80–100)
Monocytes: 12 %
Platelet: 273 10*3/uL (ref 150–440)

## 2013-09-30 LAB — HEPATIC FUNCTION PANEL A (ARMC)
Albumin: 1.4 g/dL — ABNORMAL LOW (ref 3.4–5.0)
Alkaline Phosphatase: 208 U/L — ABNORMAL HIGH (ref 50–136)
Bilirubin,Total: 17 mg/dL — ABNORMAL HIGH (ref 0.2–1.0)
SGOT(AST): 83 U/L — ABNORMAL HIGH (ref 15–37)
Total Protein: 5.3 g/dL — ABNORMAL LOW (ref 6.4–8.2)

## 2013-10-01 LAB — HEPATIC FUNCTION PANEL A (ARMC)
Alkaline Phosphatase: 217 U/L — ABNORMAL HIGH (ref 50–136)
Bilirubin, Direct: 14 mg/dL — ABNORMAL HIGH (ref 0.00–0.20)
Bilirubin,Total: 17.6 mg/dL — ABNORMAL HIGH (ref 0.2–1.0)
SGOT(AST): 80 U/L — ABNORMAL HIGH (ref 15–37)
SGPT (ALT): 19 U/L (ref 12–78)

## 2013-10-01 LAB — IRON AND TIBC
Iron Bind.Cap.(Total): 105 ug/dL — ABNORMAL LOW (ref 250–450)
Iron Saturation: 20 %
Unbound Iron-Bind.Cap.: 84 ug/dL

## 2013-10-01 LAB — PROTIME-INR: Prothrombin Time: 18.8 secs — ABNORMAL HIGH (ref 11.5–14.7)

## 2013-10-02 LAB — COMPREHENSIVE METABOLIC PANEL
Albumin: 1.4 g/dL — ABNORMAL LOW (ref 3.4–5.0)
Alkaline Phosphatase: 233 U/L — ABNORMAL HIGH (ref 50–136)
Anion Gap: 10 (ref 7–16)
BUN: 9 mg/dL (ref 7–18)
Bilirubin,Total: 20.3 mg/dL — ABNORMAL HIGH (ref 0.2–1.0)
Calcium, Total: 8.3 mg/dL — ABNORMAL LOW (ref 8.5–10.1)
Chloride: 95 mmol/L — ABNORMAL LOW (ref 98–107)
Co2: 25 mmol/L (ref 21–32)
Creatinine: 0.85 mg/dL (ref 0.60–1.30)
EGFR (African American): >60
EGFR (Non-African Amer.): >60
Glucose: 85 mg/dL (ref 65–99)
Osmolality: 259 (ref 275–301)
Potassium: 3.3 mmol/L — ABNORMAL LOW (ref 3.5–5.1)
SGOT(AST): 91 U/L — ABNORMAL HIGH (ref 15–37)
SGPT (ALT): 23 U/L (ref 12–78)
Sodium: 130 mmol/L — ABNORMAL LOW (ref 136–145)
Total Protein: 5.8 g/dL — ABNORMAL LOW (ref 6.4–8.2)

## 2013-10-02 LAB — PROTIME-INR
INR: 1.5
Prothrombin Time: 18.3 secs — ABNORMAL HIGH (ref 11.5–14.7)

## 2013-10-02 LAB — HEPATIC FUNCTION PANEL A (ARMC): Bilirubin, Direct: 16.7 mg/dL — ABNORMAL HIGH (ref 0.00–0.20)

## 2013-10-03 LAB — COMPREHENSIVE METABOLIC PANEL
Albumin: 1.2 g/dL — ABNORMAL LOW (ref 3.4–5.0)
Alkaline Phosphatase: 207 U/L — ABNORMAL HIGH (ref 50–136)
Anion Gap: 5 — ABNORMAL LOW (ref 7–16)
BUN: 10 mg/dL (ref 7–18)
Bilirubin,Total: 16.6 mg/dL — ABNORMAL HIGH (ref 0.2–1.0)
Calcium, Total: 8.2 mg/dL — ABNORMAL LOW (ref 8.5–10.1)
Chloride: 100 mmol/L (ref 98–107)
Co2: 28 mmol/L (ref 21–32)
Creatinine: 0.76 mg/dL (ref 0.60–1.30)
EGFR (African American): >60
EGFR (Non-African Amer.): >60
Glucose: 111 mg/dL — ABNORMAL HIGH (ref 65–99)
Osmolality: 266 (ref 275–301)
Potassium: 3.8 mmol/L (ref 3.5–5.1)
SGOT(AST): 79 U/L — ABNORMAL HIGH (ref 15–37)
SGPT (ALT): 21 U/L (ref 12–78)
Sodium: 133 mmol/L — ABNORMAL LOW (ref 136–145)
Total Protein: 5 g/dL — ABNORMAL LOW (ref 6.4–8.2)

## 2013-10-03 LAB — BODY FLUID CELL COUNT WITH DIFFERENTIAL
Basophil: 0 %
Eosinophil: 0 %
Lymphocytes: 15 %
Neutrophils: 45 %

## 2013-10-03 LAB — PROTIME-INR
INR: 1.6
Prothrombin Time: 19 secs — ABNORMAL HIGH (ref 11.5–14.7)

## 2013-10-03 LAB — HEPATIC FUNCTION PANEL A (ARMC): Bilirubin, Direct: 13.6 mg/dL — ABNORMAL HIGH (ref 0.00–0.20)

## 2013-10-04 LAB — COMPREHENSIVE METABOLIC PANEL
Albumin: 1.4 g/dL — ABNORMAL LOW (ref 3.4–5.0)
Alkaline Phosphatase: 212 U/L — ABNORMAL HIGH (ref 50–136)
Anion Gap: 6 — ABNORMAL LOW (ref 7–16)
BUN: 13 mg/dL (ref 7–18)
Bilirubin,Total: 13.8 mg/dL — ABNORMAL HIGH (ref 0.2–1.0)
Calcium, Total: 8.4 mg/dL — ABNORMAL LOW (ref 8.5–10.1)
Chloride: 100 mmol/L (ref 98–107)
Co2: 28 mmol/L (ref 21–32)
Creatinine: 0.82 mg/dL (ref 0.60–1.30)
EGFR (African American): >60
EGFR (Non-African Amer.): >60
Glucose: 90 mg/dL (ref 65–99)
Osmolality: 268 (ref 275–301)
Potassium: 3.4 mmol/L — ABNORMAL LOW (ref 3.5–5.1)
SGOT(AST): 123 U/L — ABNORMAL HIGH (ref 15–37)
SGPT (ALT): 35 U/L (ref 12–78)
Sodium: 134 mmol/L — ABNORMAL LOW (ref 136–145)
Total Protein: 5.2 g/dL — ABNORMAL LOW (ref 6.4–8.2)

## 2013-10-04 LAB — HEPATIC FUNCTION PANEL A (ARMC): Bilirubin, Direct: 11.3 mg/dL — ABNORMAL HIGH (ref 0.00–0.20)

## 2013-10-04 LAB — PROTIME-INR
INR: 1.5
Prothrombin Time: 18.2 secs — ABNORMAL HIGH (ref 11.5–14.7)

## 2013-10-05 LAB — CBC WITH DIFFERENTIAL/PLATELET
Bands: 5 %
Lymphocytes: 1 %
MCV: 102 fL — ABNORMAL HIGH (ref 80–100)
Platelet: 429 10*3/uL (ref 150–440)
WBC: 24.2 10*3/uL — ABNORMAL HIGH (ref 3.8–10.6)

## 2013-10-05 LAB — HEPATIC FUNCTION PANEL A (ARMC)
Bilirubin,Total: 11.8 mg/dL — ABNORMAL HIGH (ref 0.2–1.0)
SGOT(AST): 200 U/L — ABNORMAL HIGH (ref 15–37)
SGPT (ALT): 57 U/L (ref 12–78)
Total Protein: 5.7 g/dL — ABNORMAL LOW (ref 6.4–8.2)

## 2013-10-05 LAB — BASIC METABOLIC PANEL
BUN: 12 mg/dL (ref 7–18)
Creatinine: 0.9 mg/dL (ref 0.60–1.30)
Glucose: 103 mg/dL — ABNORMAL HIGH (ref 65–99)
Osmolality: 270 (ref 275–301)
Potassium: 3.9 mmol/L (ref 3.5–5.1)

## 2013-10-06 LAB — COMPREHENSIVE METABOLIC PANEL
Alkaline Phosphatase: 259 U/L — ABNORMAL HIGH (ref 50–136)
Anion Gap: 8 (ref 7–16)
BUN: 15 mg/dL (ref 7–18)
Calcium, Total: 7.8 mg/dL — ABNORMAL LOW (ref 8.5–10.1)
Co2: 27 mmol/L (ref 21–32)
EGFR (African American): >60
Glucose: 146 mg/dL — ABNORMAL HIGH (ref 65–99)
Sodium: 128 mmol/L — ABNORMAL LOW (ref 136–145)

## 2013-10-06 LAB — URINALYSIS, COMPLETE
Bilirubin,UR: NEGATIVE
Nitrite: NEGATIVE
Ph: 6 (ref 4.5–8.0)
Protein: NEGATIVE
RBC,UR: <1 /HPF (ref 0–5)
Specific Gravity: 1.003 (ref 1.003–1.030)
Squamous Epithelial: NONE SEEN

## 2013-10-06 LAB — CBC
MCH: 35.4 pg — ABNORMAL HIGH (ref 26.0–34.0)
MCV: 102 fL — ABNORMAL HIGH (ref 80–100)
RBC: 2.92 10*6/uL — ABNORMAL LOW (ref 4.40–5.90)

## 2013-10-06 LAB — PROTIME-INR: Prothrombin Time: 18.3 secs — ABNORMAL HIGH (ref 11.5–14.7)

## 2013-10-07 ENCOUNTER — Inpatient Hospital Stay: Payer: Self-pay | Admitting: Internal Medicine

## 2013-10-07 LAB — BILIRUBIN, TOTAL: Bilirubin,Total: 10.4 mg/dL — ABNORMAL HIGH (ref 0.2–1.0)

## 2013-10-08 LAB — AMMONIA: Ammonia, Plasma: 32 mcmol/L (ref 11–32)

## 2013-10-08 LAB — COMPREHENSIVE METABOLIC PANEL
Albumin: 1.5 g/dL — ABNORMAL LOW (ref 3.4–5.0)
Bilirubin,Total: 9.2 mg/dL — ABNORMAL HIGH (ref 0.2–1.0)
Calcium, Total: 8.6 mg/dL (ref 8.5–10.1)
EGFR (African American): >60
EGFR (Non-African Amer.): >60
Glucose: 99 mg/dL (ref 65–99)
Potassium: 3.4 mmol/L — ABNORMAL LOW (ref 3.5–5.1)
SGPT (ALT): 80 U/L — ABNORMAL HIGH (ref 12–78)

## 2013-10-08 LAB — CBC WITH DIFFERENTIAL/PLATELET
Eosinophil: 2 %
HCT: 27.9 % — ABNORMAL LOW (ref 40.0–52.0)
Lymphocytes: 3 %
MCH: 36.3 pg — ABNORMAL HIGH (ref 26.0–34.0)
Platelet: 346 10*3/uL (ref 150–440)
RBC: 2.7 10*6/uL — ABNORMAL LOW (ref 4.40–5.90)
Segmented Neutrophils: 83 %

## 2013-10-08 LAB — HEPATIC FUNCTION PANEL A (ARMC): Bilirubin, Direct: 7.6 mg/dL — ABNORMAL HIGH (ref 0.00–0.20)

## 2013-10-09 LAB — COMPREHENSIVE METABOLIC PANEL
Alkaline Phosphatase: 226 U/L — ABNORMAL HIGH (ref 50–136)
Anion Gap: 6 — ABNORMAL LOW (ref 7–16)
BUN: 16 mg/dL (ref 7–18)
Chloride: 92 mmol/L — ABNORMAL LOW (ref 98–107)
Co2: 33 mmol/L — ABNORMAL HIGH (ref 21–32)
Creatinine: 0.94 mg/dL (ref 0.60–1.30)
EGFR (African American): >60
Osmolality: 263 (ref 275–301)
Potassium: 3.3 mmol/L — ABNORMAL LOW (ref 3.5–5.1)
SGOT(AST): 121 U/L — ABNORMAL HIGH (ref 15–37)
Total Protein: 5.7 g/dL — ABNORMAL LOW (ref 6.4–8.2)

## 2013-10-09 LAB — CBC WITH DIFFERENTIAL/PLATELET
Bands: 5 %
HCT: 29.5 % — ABNORMAL LOW (ref 40.0–52.0)
HGB: 10.3 g/dL — ABNORMAL LOW (ref 13.0–18.0)
MCH: 35.7 pg — ABNORMAL HIGH (ref 26.0–34.0)
Platelet: 371 10*3/uL (ref 150–440)
RBC: 2.88 10*6/uL — ABNORMAL LOW (ref 4.40–5.90)
RDW: 16 % — ABNORMAL HIGH (ref 11.5–14.5)

## 2013-10-09 LAB — PROTIME-INR: INR: 1.4

## 2013-10-09 LAB — AMMONIA: Ammonia, Plasma: 38 mcmol/L — ABNORMAL HIGH (ref 11–32)

## 2013-10-10 LAB — PROTIME-INR
INR: 1.5
Prothrombin Time: 17.9 secs — ABNORMAL HIGH (ref 11.5–14.7)

## 2013-10-10 LAB — HEPATIC FUNCTION PANEL A (ARMC)
Bilirubin,Total: 8.8 mg/dL — ABNORMAL HIGH (ref 0.2–1.0)
SGOT(AST): 106 U/L — ABNORMAL HIGH (ref 15–37)
SGPT (ALT): 87 U/L — ABNORMAL HIGH (ref 12–78)
Total Protein: 6 g/dL — ABNORMAL LOW (ref 6.4–8.2)

## 2013-10-10 LAB — BODY FLUID CULTURE

## 2014-02-10 ENCOUNTER — Emergency Department: Payer: Self-pay | Admitting: Emergency Medicine

## 2014-02-10 LAB — CBC WITH DIFFERENTIAL/PLATELET
Basophil #: 0.1 x10 3/mm 3
Basophil %: 1.2 %
Eosinophil #: 0.1 x10 3/mm 3
Eosinophil %: 1.9 %
HCT: 34.8 % — ABNORMAL LOW
HGB: 10.8 g/dL — ABNORMAL LOW
Lymphocyte %: 23.5 %
Lymphs Abs: 1.5 x10 3/mm 3
MCH: 24.6 pg — ABNORMAL LOW
MCHC: 31 g/dL — ABNORMAL LOW
MCV: 79 fL — ABNORMAL LOW
Monocyte #: 0.9 "x10 3/mm "
Monocyte %: 14.6 %
Neutrophil #: 3.7 x10 3/mm 3
Neutrophil %: 58.8 %
Platelet: 216 x10 3/mm 3
RBC: 4.41 x10 6/mm 3
RDW: 16.8 % — ABNORMAL HIGH
WBC: 6.2 x10 3/mm 3

## 2014-02-10 LAB — URINALYSIS, COMPLETE
BILIRUBIN, UR: NEGATIVE
Bacteria: NONE SEEN
Blood: NEGATIVE
Glucose,UR: NEGATIVE mg/dL (ref 0–75)
KETONE: NEGATIVE
LEUKOCYTE ESTERASE: NEGATIVE
NITRITE: NEGATIVE
Ph: 6 (ref 4.5–8.0)
Protein: 100
RBC, UR: NONE SEEN /HPF (ref 0–5)
Specific Gravity: 1.001 (ref 1.003–1.030)
WBC UR: NONE SEEN /HPF (ref 0–5)

## 2014-02-11 LAB — ETHANOL
Ethanol %: 0.329 % (ref 0.000–0.080)
Ethanol: 329 mg/dL

## 2014-02-11 LAB — COMPREHENSIVE METABOLIC PANEL
ANION GAP: 10 (ref 7–16)
Albumin: 4.1 g/dL (ref 3.4–5.0)
Alkaline Phosphatase: 143 U/L — ABNORMAL HIGH
BILIRUBIN TOTAL: 0.6 mg/dL (ref 0.2–1.0)
BUN: 5 mg/dL — AB (ref 7–18)
CALCIUM: 9.3 mg/dL (ref 8.5–10.1)
CREATININE: 0.74 mg/dL (ref 0.60–1.30)
Chloride: 101 mmol/L (ref 98–107)
Co2: 26 mmol/L (ref 21–32)
EGFR (African American): >60
Glucose: 102 mg/dL — ABNORMAL HIGH (ref 65–99)
Osmolality: 271 (ref 275–301)
Potassium: 3.7 mmol/L (ref 3.5–5.1)
SGOT(AST): 61 U/L — ABNORMAL HIGH (ref 15–37)
SGPT (ALT): 35 U/L (ref 12–78)
SODIUM: 137 mmol/L (ref 136–145)
TOTAL PROTEIN: 9.1 g/dL — AB (ref 6.4–8.2)

## 2014-02-11 LAB — CK TOTAL AND CKMB (NOT AT ARMC)
CK, Total: 88 U/L
CK-MB: <0.5 ng/mL — ABNORMAL LOW (ref 0.5–3.6)

## 2014-02-11 LAB — DRUG SCREEN, URINE

## 2014-02-11 LAB — TROPONIN I

## 2014-02-11 LAB — AMMONIA: AMMONIA, PLASMA: 46 umol/L — AB (ref 11–32)

## 2014-02-16 ENCOUNTER — Ambulatory Visit: Payer: Self-pay | Admitting: Gastroenterology

## 2014-03-16 ENCOUNTER — Emergency Department: Payer: Self-pay | Admitting: Emergency Medicine

## 2014-03-16 LAB — COMPREHENSIVE METABOLIC PANEL
ALT: 47 U/L (ref 12–78)
Albumin: 3.9 g/dL (ref 3.4–5.0)
Alkaline Phosphatase: 125 U/L — ABNORMAL HIGH
Anion Gap: 12 (ref 7–16)
BUN: 7 mg/dL (ref 7–18)
Bilirubin,Total: 1.2 mg/dL — ABNORMAL HIGH (ref 0.2–1.0)
Calcium, Total: 9.3 mg/dL (ref 8.5–10.1)
Chloride: 96 mmol/L — ABNORMAL LOW (ref 98–107)
Co2: 25 mmol/L (ref 21–32)
Creatinine: 1.02 mg/dL (ref 0.60–1.30)
EGFR (Non-African Amer.): >60
GLUCOSE: 131 mg/dL — AB (ref 65–99)
OSMOLALITY: 266 (ref 275–301)
Potassium: 3.7 mmol/L (ref 3.5–5.1)
SGOT(AST): 103 U/L — ABNORMAL HIGH (ref 15–37)
Sodium: 133 mmol/L — ABNORMAL LOW (ref 136–145)
Total Protein: 8.6 g/dL — ABNORMAL HIGH (ref 6.4–8.2)

## 2014-03-16 LAB — CBC
HCT: 31.3 % — ABNORMAL LOW (ref 40.0–52.0)
HGB: 9.5 g/dL — ABNORMAL LOW (ref 13.0–18.0)
MCH: 23.1 pg — AB (ref 26.0–34.0)
MCHC: 30.5 g/dL — ABNORMAL LOW (ref 32.0–36.0)
MCV: 76 fL — AB (ref 80–100)
Platelet: 208 10*3/uL (ref 150–440)
RBC: 4.13 10*6/uL — AB (ref 4.40–5.90)
RDW: 17.7 % — AB (ref 11.5–14.5)
WBC: 9.4 10*3/uL (ref 3.8–10.6)

## 2014-03-16 LAB — AMMONIA: AMMONIA, PLASMA: 40 umol/L — AB (ref 11–32)

## 2014-03-16 LAB — PROTIME-INR
INR: 1.2
Prothrombin Time: 14.6 secs (ref 11.5–14.7)

## 2014-03-16 LAB — ETHANOL
Ethanol %: 0.021 % (ref 0.000–0.080)
Ethanol: 21 mg/dL

## 2014-03-27 ENCOUNTER — Emergency Department: Payer: Self-pay | Admitting: Emergency Medicine

## 2014-04-04 ENCOUNTER — Inpatient Hospital Stay: Payer: Self-pay | Admitting: Internal Medicine

## 2014-04-04 LAB — CBC
HCT: 29.6 % — AB (ref 40.0–52.0)
HGB: 9.4 g/dL — AB (ref 13.0–18.0)
MCH: 22.9 pg — ABNORMAL LOW (ref 26.0–34.0)
MCHC: 31.9 g/dL — ABNORMAL LOW (ref 32.0–36.0)
MCV: 72 fL — ABNORMAL LOW (ref 80–100)
Platelet: 233 10*3/uL (ref 150–440)
RBC: 4.11 10*6/uL — ABNORMAL LOW (ref 4.40–5.90)
RDW: 17.6 % — AB (ref 11.5–14.5)
WBC: 11.6 10*3/uL — AB (ref 3.8–10.6)

## 2014-04-04 LAB — BASIC METABOLIC PANEL
Anion Gap: 13 (ref 7–16)
Anion Gap: 8 (ref 7–16)
BUN: 13 mg/dL (ref 7–18)
BUN: 11 mg/dL (ref 7–18)
CHLORIDE: 74 mmol/L — AB (ref 98–107)
CO2: 31 mmol/L (ref 21–32)
CREATININE: 0.92 mg/dL (ref 0.60–1.30)
Calcium, Total: 9.3 mg/dL (ref 8.5–10.1)
Calcium, Total: 9.2 mg/dL (ref 8.5–10.1)
Chloride: 80 mmol/L — ABNORMAL LOW (ref 98–107)
Co2: 33 mmol/L — ABNORMAL HIGH (ref 21–32)
Creatinine: 0.79 mg/dL (ref 0.60–1.30)
EGFR (African American): >60
EGFR (Non-African Amer.): >60
GLUCOSE: 148 mg/dL — AB (ref 65–99)
GLUCOSE: 98 mg/dL (ref 65–99)
OSMOLALITY: 243 (ref 275–301)
Osmolality: 241 (ref 275–301)
Potassium: 2.3 mmol/L — CL (ref 3.5–5.1)
Potassium: 2.7 mmol/L — ABNORMAL LOW (ref 3.5–5.1)
SODIUM: 118 mmol/L — AB (ref 136–145)
Sodium: 121 mmol/L — ABNORMAL LOW (ref 136–145)

## 2014-04-04 LAB — COMPREHENSIVE METABOLIC PANEL
Albumin: 3.7 g/dL (ref 3.4–5.0)
Alkaline Phosphatase: 118 U/L — ABNORMAL HIGH
Anion Gap: 9 (ref 7–16)
BILIRUBIN TOTAL: 1.3 mg/dL — AB (ref 0.2–1.0)
BUN: 13 mg/dL (ref 7–18)
CHLORIDE: 75 mmol/L — AB (ref 98–107)
CREATININE: 0.8 mg/dL (ref 0.60–1.30)
Calcium, Total: 9 mg/dL (ref 8.5–10.1)
Co2: 34 mmol/L — ABNORMAL HIGH (ref 21–32)
Glucose: 120 mg/dL — ABNORMAL HIGH (ref 65–99)
OSMOLALITY: 240 (ref 275–301)
Potassium: 2.9 mmol/L — ABNORMAL LOW (ref 3.5–5.1)
SGOT(AST): 162 U/L — ABNORMAL HIGH (ref 15–37)
SGPT (ALT): 62 U/L (ref 12–78)
SODIUM: 118 mmol/L — AB (ref 136–145)
Total Protein: 9.1 g/dL — ABNORMAL HIGH (ref 6.4–8.2)

## 2014-04-04 LAB — PHENYTOIN LEVEL, TOTAL

## 2014-04-04 LAB — OSMOLALITY: Osmolality: 259 mOsm/kg — ABNORMAL LOW (ref 275–295)

## 2014-04-04 LAB — MAGNESIUM: Magnesium: 1.9 mg/dL

## 2014-04-05 ENCOUNTER — Ambulatory Visit: Payer: Self-pay | Admitting: Neurology

## 2014-04-05 LAB — CBC WITH DIFFERENTIAL/PLATELET
BASOS ABS: 0 10*3/uL (ref 0.0–0.1)
Basophil %: 0.5 %
Eosinophil #: 0.1 10*3/uL (ref 0.0–0.7)
Eosinophil %: 1.3 %
HCT: 28.5 % — ABNORMAL LOW (ref 40.0–52.0)
HGB: 9.3 g/dL — AB (ref 13.0–18.0)
Lymphocyte #: 0.8 10*3/uL — ABNORMAL LOW (ref 1.0–3.6)
Lymphocyte %: 9.6 %
MCH: 23.6 pg — AB (ref 26.0–34.0)
MCHC: 32.5 g/dL (ref 32.0–36.0)
MCV: 73 fL — ABNORMAL LOW (ref 80–100)
Monocyte #: 1.3 x10 3/mm — ABNORMAL HIGH (ref 0.2–1.0)
Monocyte %: 16.1 %
NEUTROS PCT: 72.5 %
Neutrophil #: 6 10*3/uL (ref 1.4–6.5)
PLATELETS: 205 10*3/uL (ref 150–440)
RBC: 3.93 10*6/uL — ABNORMAL LOW (ref 4.40–5.90)
RDW: 17.7 % — ABNORMAL HIGH (ref 11.5–14.5)
WBC: 8.2 10*3/uL (ref 3.8–10.6)

## 2014-04-05 LAB — BASIC METABOLIC PANEL
Anion Gap: 5 — ABNORMAL LOW (ref 7–16)
BUN: 11 mg/dL (ref 7–18)
Calcium, Total: 8.8 mg/dL (ref 8.5–10.1)
Chloride: 88 mmol/L — ABNORMAL LOW (ref 98–107)
Co2: 33 mmol/L — ABNORMAL HIGH (ref 21–32)
Creatinine: 0.75 mg/dL (ref 0.60–1.30)
EGFR (African American): >60
EGFR (Non-African Amer.): >60
Glucose: 100 mg/dL — ABNORMAL HIGH (ref 65–99)
OSMOLALITY: 253 (ref 275–301)
Potassium: 3.1 mmol/L — ABNORMAL LOW (ref 3.5–5.1)
Sodium: 126 mmol/L — ABNORMAL LOW (ref 136–145)

## 2014-04-05 LAB — TSH: Thyroid Stimulating Horm: 5.25 u[IU]/mL — ABNORMAL HIGH

## 2014-04-06 LAB — BASIC METABOLIC PANEL
ANION GAP: 7 (ref 7–16)
BUN: 10 mg/dL (ref 7–18)
CALCIUM: 8.9 mg/dL (ref 8.5–10.1)
CHLORIDE: 94 mmol/L — AB (ref 98–107)
Co2: 30 mmol/L (ref 21–32)
Creatinine: 0.73 mg/dL (ref 0.60–1.30)
EGFR (African American): >60
GLUCOSE: 102 mg/dL — AB (ref 65–99)
Osmolality: 262 (ref 275–301)
POTASSIUM: 3.3 mmol/L — AB (ref 3.5–5.1)
Sodium: 131 mmol/L — ABNORMAL LOW (ref 136–145)

## 2014-06-02 IMAGING — CT CT HEAD WITHOUT CONTRAST
3 of 5 series · 9 of 47 positions shown, 11 images · non-contrast
Comparison: Brain CT 03/03/2013; cervical spine CT 03/03/2013 ;
Brain MRI August 09, 2013

CLINICAL DATA: Seizure with trauma

EXAM:
CT HEAD WITHOUT CONTRAST
CT CERVICAL SPINE WITHOUT CONTRAST
TECHNIQUE: Multidetector CT imaging of the head and cervical spine was
performed following the standard protocol without intravenous
contrast. Multiplanar CT image reconstructions of the cervical spine
were also generated.

[Series 6: sag bone · sagittal · 0.21mm/px · 5 of 47 slices shown, 6 images]
[im 8/47  bone]
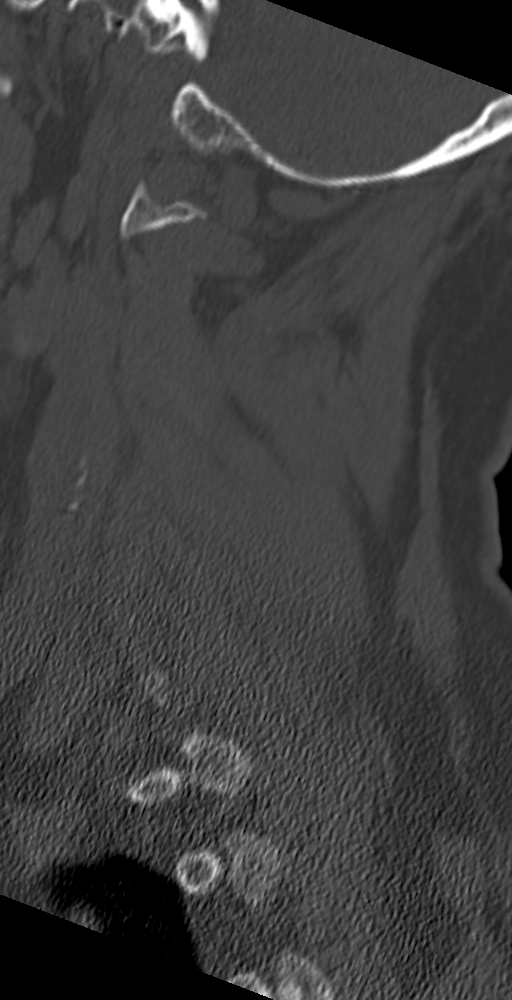
[im 16/47  bone]
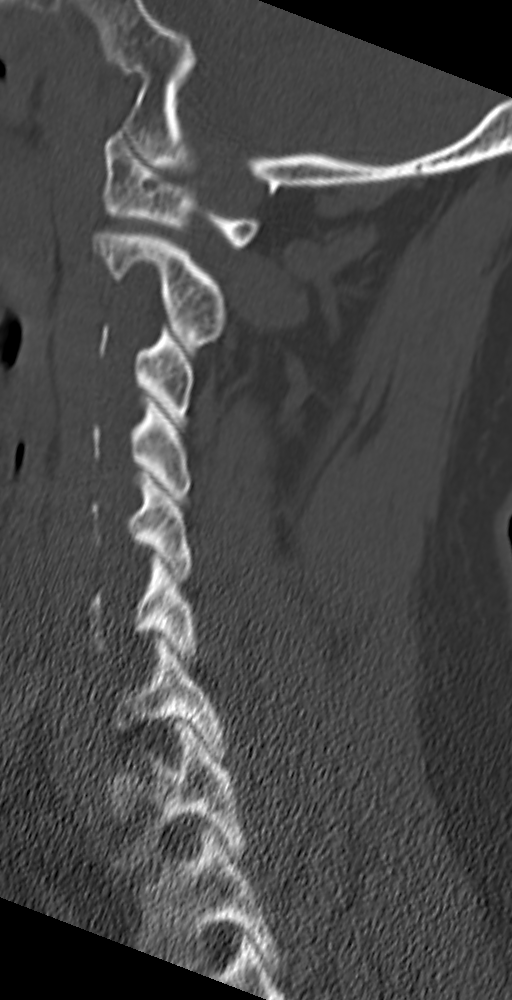
[im 24/47  brain]
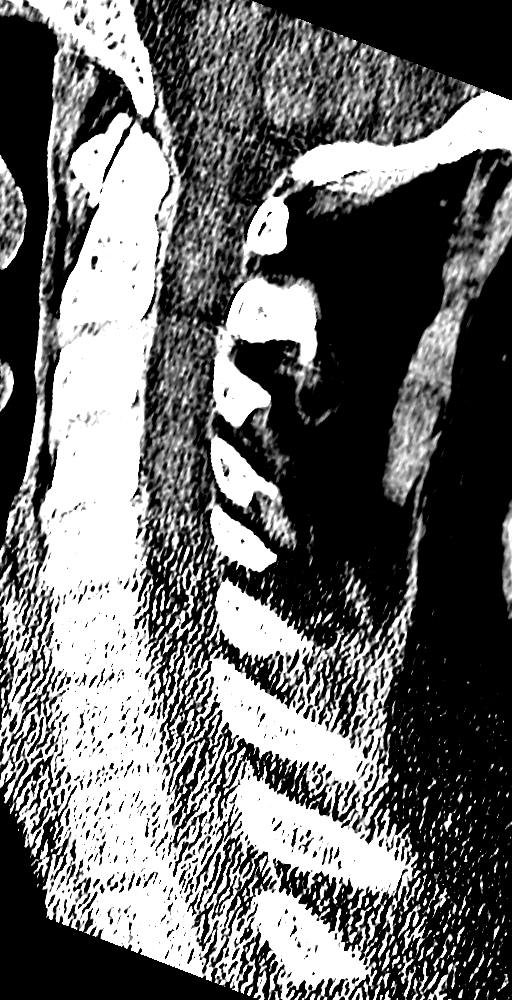
[im 24/47  bone]
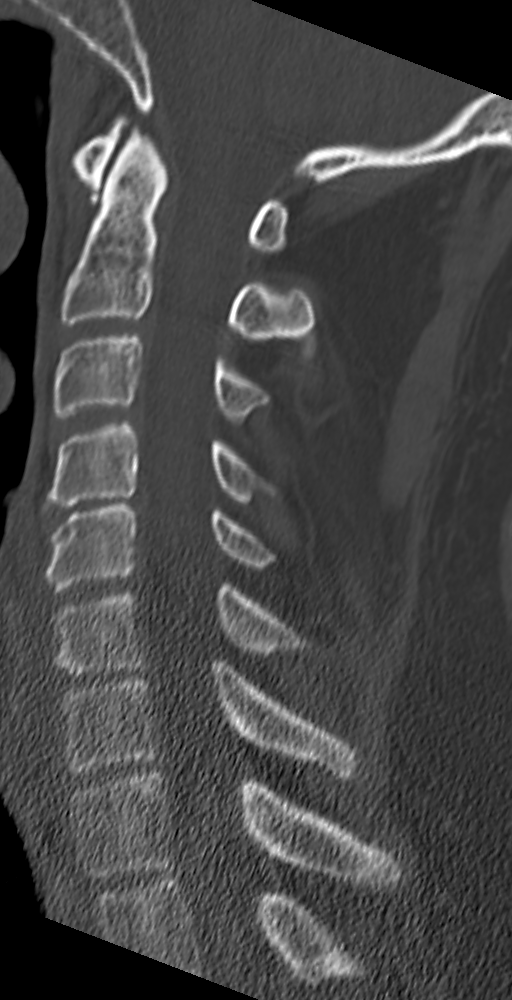
[im 31/47  bone]
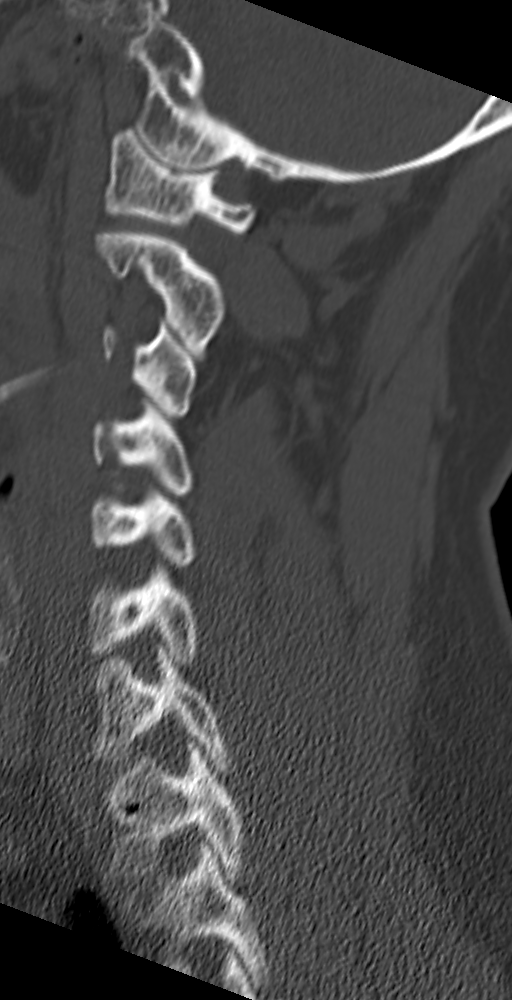
[im 39/47  bone]
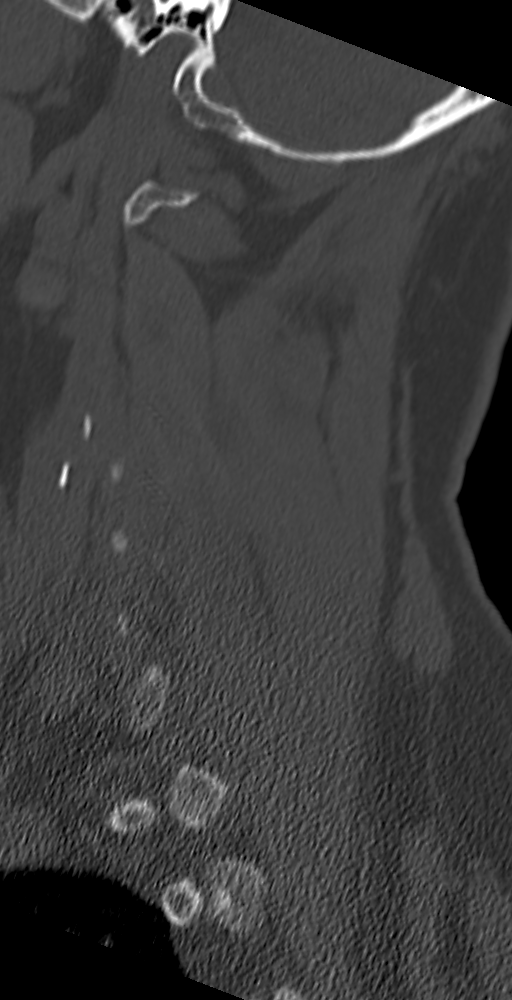

[Series 7: cor bone · coronal · 0.21mm/px · 3 of 46 slices shown]
[im 16/46  bone]
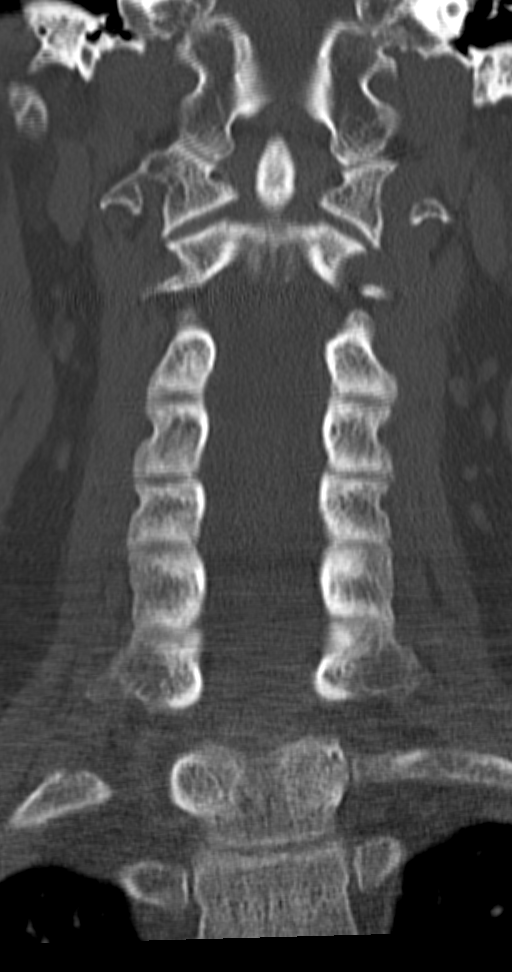
[im 21/46  bone]
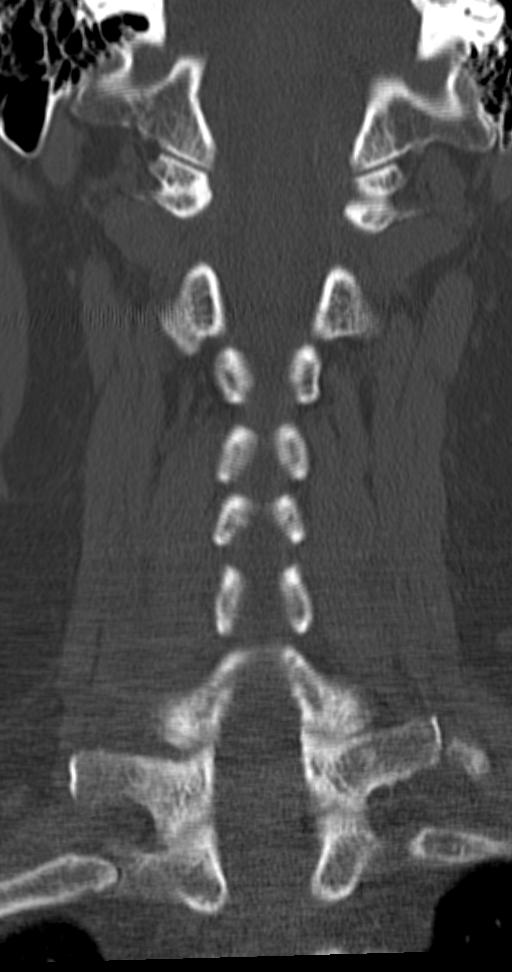
[im 26/46  bone]
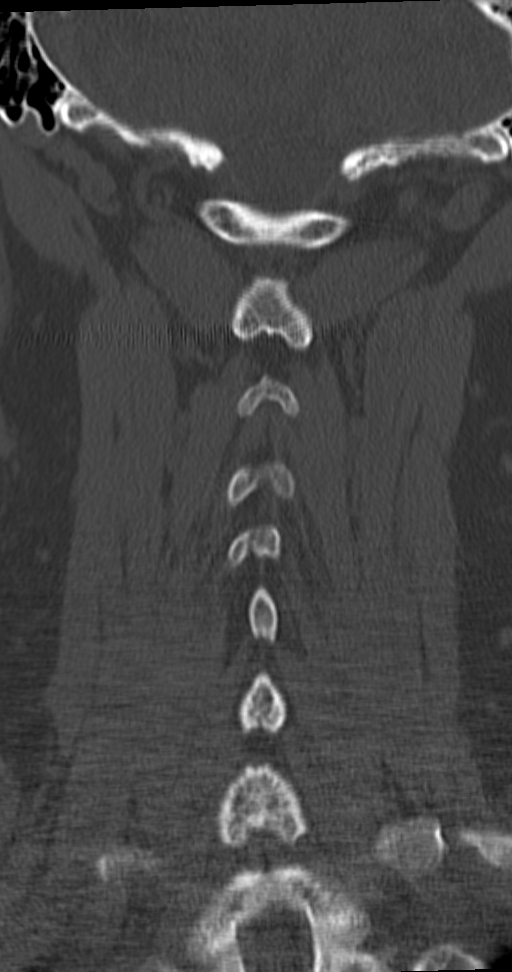

[Series 8: orthogonal axials · axial · 0.18mm/px · z∈[-164,-164]mm · 1 of 99 slices shown, 2 images]
[im 50/99  brain]
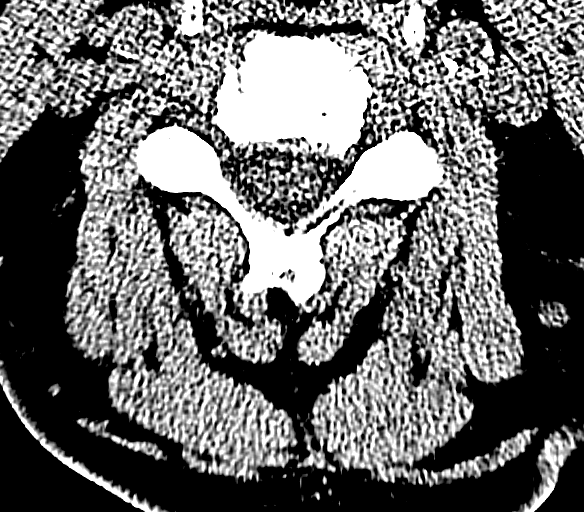
[im 50/99  bone]
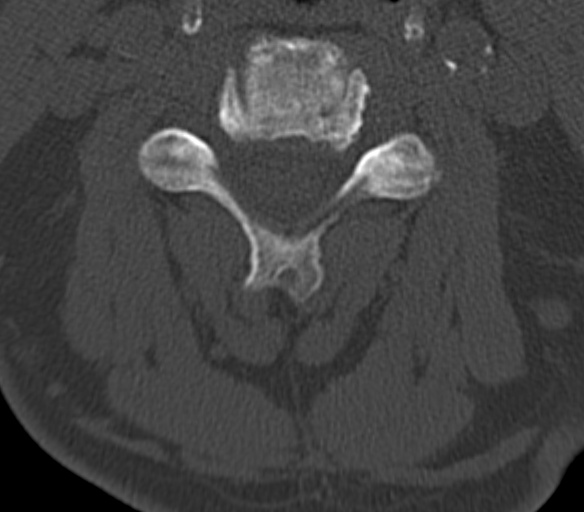

[9 of 47 positions shown; findings below may reference images not displayed]

FINDINGS: CT HEAD FINDINGS

There is mild generalized atrophy, stable. There is no mass, acute
hemorrhage, extra-axial fluid collection, or midline shift. There is
encephalomalacia in posterior mid left temporal lobe in an area of
previous hemorrhage. Elsewhere, gray-white compartments appear
unremarkable. No acute infarct is appreciable on this study.

There is no midline shift or extra-axial fluid.

Bony calvarium appears intact. The mastoid air cells are clear.
There is debris in both external auditory canals. There is a
moderate right parietal scalp hematoma.

CT CERVICAL SPINE FINDINGS

There is no fracture or spondylolisthesis. Prevertebral soft tissues
and predental space regions are normal. There is moderately severe
disc space narrowing at C4-5. There is moderate disc space narrowing
at C6-7. There is facet hypertrophy causing exit foraminal narrowing
on the left at C4-5. No disc extrusion or stenosis.
IMPRESSION: CT head: Encephalomalacia in the posterior mid left temporal lobe in
an area of previous hemorrhage. Mild atrophy present. There is no
intracranial mass, acute hemorrhage, or extra-axial fluid. No acute
infarct apparent. There is a right parietal scalp hematoma. There is
probable cerumen in both external auditory canals.

CT cervical spine: Areas of osteoarthritic change, most pronounced
on the left at C4-5. No fracture or spondylolisthesis.

## 2014-06-20 ENCOUNTER — Emergency Department: Payer: Self-pay | Admitting: Emergency Medicine

## 2014-07-02 ENCOUNTER — Ambulatory Visit: Payer: Self-pay | Admitting: Gastroenterology

## 2014-07-02 ENCOUNTER — Inpatient Hospital Stay: Payer: Self-pay | Admitting: Internal Medicine

## 2014-07-02 LAB — URINALYSIS, COMPLETE
Bacteria: NONE SEEN
Bilirubin,UR: NEGATIVE
Blood: NEGATIVE
GLUCOSE, UR: NEGATIVE mg/dL (ref 0–75)
Ketone: NEGATIVE
Leukocyte Esterase: NEGATIVE
NITRITE: NEGATIVE
PH: 7 (ref 4.5–8.0)
Protein: NEGATIVE
RBC,UR: <1 /HPF (ref 0–5)
SPECIFIC GRAVITY: 1.002 (ref 1.003–1.030)
Squamous Epithelial: NONE SEEN
WBC UR: <1 /HPF (ref 0–5)

## 2014-07-02 LAB — CBC WITH DIFFERENTIAL/PLATELET
BASOS PCT: 1.1 %
Basophil #: 0.1 10*3/uL (ref 0.0–0.1)
EOS ABS: 0 10*3/uL (ref 0.0–0.7)
EOS PCT: 0.4 %
HCT: 25.6 % — ABNORMAL LOW (ref 40.0–52.0)
HGB: 8.4 g/dL — ABNORMAL LOW (ref 13.0–18.0)
Lymphocyte #: 0.8 10*3/uL — ABNORMAL LOW (ref 1.0–3.6)
Lymphocyte %: 9.3 %
MCH: 22.4 pg — ABNORMAL LOW (ref 26.0–34.0)
MCHC: 32.8 g/dL (ref 32.0–36.0)
MCV: 68 fL — ABNORMAL LOW (ref 80–100)
MONO ABS: 1 x10 3/mm (ref 0.2–1.0)
MONOS PCT: 12.2 %
NEUTROS ABS: 6.3 10*3/uL (ref 1.4–6.5)
Neutrophil %: 77 %
PLATELETS: 345 10*3/uL (ref 150–440)
RBC: 3.75 10*6/uL — ABNORMAL LOW (ref 4.40–5.90)
RDW: 19 % — AB (ref 11.5–14.5)
WBC: 8.2 10*3/uL (ref 3.8–10.6)

## 2014-07-02 LAB — AMMONIA: Ammonia, Plasma: 46 mcmol/L — ABNORMAL HIGH (ref 11–32)

## 2014-07-02 LAB — COMPREHENSIVE METABOLIC PANEL
ALBUMIN: 3.3 g/dL — AB (ref 3.4–5.0)
AST: 40 U/L — AB (ref 15–37)
Alkaline Phosphatase: 101 U/L
Anion Gap: 10 (ref 7–16)
BUN: 10 mg/dL (ref 7–18)
Bilirubin,Total: 1.6 mg/dL — ABNORMAL HIGH (ref 0.2–1.0)
Calcium, Total: 9.3 mg/dL (ref 8.5–10.1)
Chloride: 75 mmol/L — ABNORMAL LOW (ref 98–107)
Co2: 33 mmol/L — ABNORMAL HIGH (ref 21–32)
Creatinine: 1.02 mg/dL (ref 0.60–1.30)
EGFR (African American): >60
Glucose: 150 mg/dL — ABNORMAL HIGH (ref 65–99)
Osmolality: 240 (ref 275–301)
Potassium: 1.8 mmol/L — CL (ref 3.5–5.1)
SGPT (ALT): 28 U/L (ref 12–78)
Sodium: 118 mmol/L — CL (ref 136–145)
TOTAL PROTEIN: 8.2 g/dL (ref 6.4–8.2)

## 2014-07-02 LAB — TROPONIN I: Troponin-I: <0.02 ng/mL

## 2014-07-02 LAB — MAGNESIUM: MAGNESIUM: 1.8 mg/dL

## 2014-07-02 LAB — TSH: Thyroid Stimulating Horm: 1.78 u[IU]/mL

## 2014-07-03 LAB — BASIC METABOLIC PANEL
ANION GAP: 8 (ref 7–16)
BUN: 6 mg/dL — AB (ref 7–18)
CALCIUM: 8.6 mg/dL (ref 8.5–10.1)
CREATININE: 0.82 mg/dL (ref 0.60–1.30)
Chloride: 83 mmol/L — ABNORMAL LOW (ref 98–107)
Co2: 35 mmol/L — ABNORMAL HIGH (ref 21–32)
EGFR (Non-African Amer.): >60
GLUCOSE: 101 mg/dL — AB (ref 65–99)
Osmolality: 251 (ref 275–301)
POTASSIUM: 1.9 mmol/L — AB (ref 3.5–5.1)
Sodium: 126 mmol/L — ABNORMAL LOW (ref 136–145)

## 2014-07-03 LAB — SODIUM, URINE, RANDOM: Sodium, Urine Random: 15 mmol/L (ref 20–110)

## 2014-07-03 LAB — MAGNESIUM: MAGNESIUM: 2.1 mg/dL

## 2014-07-03 LAB — OSMOLALITY, URINE: OSMOLALITY: 102 mosm/kg

## 2014-07-04 LAB — BASIC METABOLIC PANEL
ANION GAP: 8 (ref 7–16)
BUN: 9 mg/dL (ref 7–18)
CREATININE: 0.85 mg/dL (ref 0.60–1.30)
Calcium, Total: 9.1 mg/dL (ref 8.5–10.1)
Chloride: 85 mmol/L — ABNORMAL LOW (ref 98–107)
Co2: 35 mmol/L — ABNORMAL HIGH (ref 21–32)
EGFR (Non-African Amer.): >60
Glucose: 97 mg/dL (ref 65–99)
Osmolality: 256 (ref 275–301)
Potassium: 2.4 mmol/L — CL (ref 3.5–5.1)
SODIUM: 128 mmol/L — AB (ref 136–145)

## 2014-07-04 LAB — MAGNESIUM: Magnesium: 2 mg/dL

## 2014-07-05 LAB — BASIC METABOLIC PANEL
ANION GAP: 7 (ref 7–16)
BUN: 9 mg/dL (ref 7–18)
CHLORIDE: 92 mmol/L — AB (ref 98–107)
CREATININE: 0.73 mg/dL (ref 0.60–1.30)
Calcium, Total: 8.4 mg/dL — ABNORMAL LOW (ref 8.5–10.1)
Co2: 33 mmol/L — ABNORMAL HIGH (ref 21–32)
EGFR (African American): >60
GLUCOSE: 97 mg/dL (ref 65–99)
OSMOLALITY: 263 (ref 275–301)
Potassium: 2.8 mmol/L — ABNORMAL LOW (ref 3.5–5.1)
Sodium: 132 mmol/L — ABNORMAL LOW (ref 136–145)

## 2014-07-06 LAB — BASIC METABOLIC PANEL
Anion Gap: 8 (ref 7–16)
BUN: 6 mg/dL — ABNORMAL LOW (ref 7–18)
CO2: 30 mmol/L (ref 21–32)
Calcium, Total: 9.1 mg/dL (ref 8.5–10.1)
Chloride: 97 mmol/L — ABNORMAL LOW (ref 98–107)
Creatinine: 0.77 mg/dL (ref 0.60–1.30)
EGFR (African American): >60
EGFR (Non-African Amer.): >60
Glucose: 95 mg/dL (ref 65–99)
Osmolality: 268 (ref 275–301)
Potassium: 3.5 mmol/L (ref 3.5–5.1)
Sodium: 135 mmol/L — ABNORMAL LOW (ref 136–145)

## 2014-07-06 LAB — CBC WITH DIFFERENTIAL/PLATELET
BASOS PCT: 0.9 %
Basophil #: 0.1 10*3/uL (ref 0.0–0.1)
EOS ABS: 0.2 10*3/uL (ref 0.0–0.7)
Eosinophil %: 3.7 %
HCT: 26.7 % — ABNORMAL LOW (ref 40.0–52.0)
HGB: 8.2 g/dL — AB (ref 13.0–18.0)
Lymphocyte #: 0.9 10*3/uL — ABNORMAL LOW (ref 1.0–3.6)
Lymphocyte %: 15 %
MCH: 21.9 pg — AB (ref 26.0–34.0)
MCHC: 30.8 g/dL — ABNORMAL LOW (ref 32.0–36.0)
MCV: 71 fL — ABNORMAL LOW (ref 80–100)
MONOS PCT: 10.6 %
Monocyte #: 0.7 x10 3/mm (ref 0.2–1.0)
NEUTROS PCT: 69.8 %
Neutrophil #: 4.3 10*3/uL (ref 1.4–6.5)
PLATELETS: 406 10*3/uL (ref 150–440)
RBC: 3.76 10*6/uL — ABNORMAL LOW (ref 4.40–5.90)
RDW: 19.9 % — ABNORMAL HIGH (ref 11.5–14.5)
WBC: 6.1 10*3/uL (ref 3.8–10.6)

## 2014-07-06 LAB — AMMONIA: AMMONIA, PLASMA: 61 umol/L — AB (ref 11–32)

## 2015-02-12 ENCOUNTER — Ambulatory Visit: Payer: Self-pay | Admitting: Family Medicine

## 2015-03-25 ENCOUNTER — Ambulatory Visit: Admit: 2015-03-25 | Disposition: A | Discharge status: Home / Self Care | Payer: Self-pay | Admitting: Gastroenterology

## 2015-03-25 LAB — PROTIME-INR
INR: 1
Prothrombin Time: 13.8 secs

## 2015-03-25 LAB — PLATELET COUNT: Platelet: 303 10*3/uL (ref 150–440)

## 2015-04-05 NOTE — Discharge Summary (Signed)
PATIENT NAME:  Seth GeneraDAVIS, Seth [name] MR#:  [id number] DATE OF BIRTH:  02-13-1971  DATE OF ADMISSION:  09/28/2013 DATE OF DISCHARGE:  10/05/2013  ADMITTING PHYSICIAN: Shaune PollackQing Chen, MD  DISCHARGING PHYSICIAN:  Silas FloodSheikh A. Ellsworth Lennoxejan-Sie, MD  CONSULTATION: Neurology, Dr. Mellody DrownMatthew Smith and Dr. Theora MasterZachary Potter; gastroenterology, Dr. Barnetta ChapelMartin Skulskie.  IMAGING: Ultrasound of the abdomen which showed hepatosplenomegaly with fatty liver.   PROCEDURE:  Ultrasound-guided paracentesis on 10/03/2013 with 25 mL of straw-colored fluid removed, negative for infection.   DISCHARGE MEDICATIONS: New medications: Gabapentin in 300 mg t.i.[name]., furosemide 20 mg daily, ciprofloxacin 250 mg once a week, Aldactone 25 mg daily, prednisone 40 mg daily, Valium 5 mg t.i.[name]., chlorthalidone 25 mg daily, Prilosec 20 mg b.i.[name].   DISCHARGE DIAGNOSES: 1.  Alcoholic- and drug-induced hepatitis.  2.  Anxiety disorder.  3.  Seizure disorder.  4.  Hypertension.   HOSPITAL COURSE: This gentleman was admitted through the Emergency Room after being sent there from his neurologist's office who noted markedly abnormal liver function tests, particularly his bilirubin, following initiation of Depakote therapy for his seizure disorder. Please refer to the history and physical for full details. The patient was on admitted to the medical floor. He was taken off Depakote. During his hospital stay, he experienced 1 seizure. Subsequently, a consult with Dr. Mellody DrownMatthew Smith, who started patient on gabapentin for his antiseizure medication. The patient did not exhibit any further seizures in-house subsequent to that. A gastroenterology consultation was placed with Dr. Marva PandaSkulskie, who ordered an ultrasound which showed fatty liver changes. His ultrasound-guided paracentesis only yielded 25 mL of fluid. Cell count was negative for spontaneous bacterial peritonitis or other infective etiology. After this, Dr. Marva PandaSkulskie discussed this case with [facility] who recommended that he  started on oral steroids for his drug-induced and alcoholic hepatitis. His serum bilirubin started declining following initiation of this therapy from high bilirubin of 17 with alkaline phosphorous 23 which gradually declined to 11.8 on the day of discharge. The patient is discharged to home in satisfactory condition and advised to stop drinking alcohol, continue oral medications that were adjusted during his current hospital stay, to stop taking the Depakote.   DIET: Hepatic, low-sodium diet.   ACTIVITY: As tolerated.   FOLLOWUP: With Dr. Ellsworth Lennoxejan-Sie in 1 to 2 weeks, with Dr. Barnetta ChapelMartin Skulskie in 1 to 2 weeks and Dr. Theora MasterZachary Potter in 1 to 2 weeks.   DISPOSITION: To home.   DISCHARGE PROCESS TIME SPENT: 35 minutes.    ____________________________ Silas FloodSheikh A. Ellsworth Lennoxejan-Sie, MD sat:cs [name]: 10/22/2013 11:50:47 ET T: 10/22/2013 18:34:36 ET JOB#: [id number]  cc: Sheikh A. Ellsworth Lennoxejan-Sie, MD, <Dictator> Charlesetta GaribaldiSHEIKH A TEJAN-SIE MD ELECTRONICALLY SIGNED 10/23/2013 13:32

## 2015-04-05 NOTE — Consult Note (Signed)
Chief Complaint:  Subjective/Chief Complaint seen for abnormal liver tests. some increased epigastric discomfort today.  no n.v .  less lower abdominal discomfort. patient reports bm today, not recorded   VITAL SIGNS/ANCILLARY NOTES: **Vital Signs.:   20-Oct-14 13:46  Vital Signs Type Routine  Temperature Temperature (F) 98.2  Celsius 36.7  Temperature Source AdultAxillary  Pulse Pulse 104  Respirations Respirations 20  Systolic BP Systolic BP 537  Diastolic BP (mmHg) Diastolic BP (mmHg) 76  Mean BP 94  Pulse Ox % Pulse Ox % 94  Pulse Ox Activity Level  At rest  Oxygen Delivery Room Air/ 21 %   Brief Assessment:  Cardiac Regular   Respiratory clear BS   Gastrointestinal details normal No rebound tenderness  tense ascites, mild tenderness actoss the epigastrum   Lab Results: Hepatic:  16-Oct-14 19:33   Bilirubin, Total  17.4  Alkaline Phosphatase  232  SGPT (ALT) 24  SGOT (AST)  86  Total Protein, Serum  6.2  Albumin, Serum  1.7  17-Oct-14 04:32   Bilirubin, Direct  13.60 (Result(s) reported on 29 Sep 2013 at 03:59PM.)  Bilirubin, Total  17.6  Alkaline Phosphatase  227  SGPT (ALT) 24  SGOT (AST)  89  Total Protein, Serum  6.0  Albumin, Serum  1.6  18-Oct-14 02:58   Bilirubin, Direct  13.50 (Result(s) reported on 30 Sep 2013 at 03:58AM.)  Bilirubin, Total  17.0  Alkaline Phosphatase  208  SGPT (ALT) 22  SGOT (AST)  83  Total Protein, Serum  5.3  Albumin, Serum  1.4  19-Oct-14 04:24   Bilirubin, Direct  14.00 (Result(s) reported on 01 Oct 2013 at 05:50AM.)  Bilirubin, Total  17.6  Alkaline Phosphatase  217  SGPT (ALT) 19  SGOT (AST)  80  Total Protein, Serum  5.2  Albumin, Serum  1.3  20-Oct-14 04:21   Bilirubin, Direct  16.70 (Result(s) reported on 02 Oct 2013 at 06:00AM.)  Bilirubin, Total  20.3  Alkaline Phosphatase  233  SGPT (ALT) 23  SGOT (AST)  91  Total Protein, Serum  5.8  Albumin, Serum  1.4  Routine Chem:  20-Oct-14 04:21   Glucose, Serum  85  BUN 9  Creatinine (comp) 0.85  Sodium, Serum  130  Potassium, Serum  3.3  Chloride, Serum  95  CO2, Serum 25  Calcium (Total), Serum  8.3  Osmolality (calc) 259  eGFR (African American) >60  eGFR (Non-African American) >60 (eGFR values <44m/min/1.73 m2 may be an indication of chronic kidney disease (CKD). Calculated eGFR is useful in patients with stable renal function. The eGFR calculation will not be reliable in acutely ill patients when serum creatinine is changing rapidly. It is not useful in  patients on dialysis. The eGFR calculation may not be applicable to patients at the low and high extremes of body sizes, pregnant women, and vegetarians.)  Anion Gap 10  Routine Coag:  20-Oct-14 04:21   Prothrombin  18.3  INR 1.5 (INR reference interval applies to patients on anticoagulant therapy. A single INR therapeutic range for coumarins is not optimal for all indications; however, the suggested range for most indications is 2.0 - 3.0. Exceptions to the INR Reference Range may include: Prosthetic heart valves, acute myocardial infarction, prevention of myocardial infarction, and combinations of aspirin and anticoagulant. The need for a higher or lower target INR must be assessed individually. Reference: The Pharmacology and Management of the Vitamin K  antagonists: the seventh ACCP Conference on Antithrombotic and Thrombolytic Therapy. Chest.2004  Sept:126 (3suppl): N9146842. A HCT value >55% may artifactually increase the PT.  In one study,  the increase was an average of 25%. Reference:  "Effect on Routine and Special Coagulation Testing Values of Citrate Anticoagulant Adjustment in Patients with High HCT Values." American Journal of Clinical Pathology 2006;126:400-405.)   Radiology Results: [place]:    17-Oct-14 09:21, US Abdomen General Survey  US Abdomen General Survey   REASON FOR EXAM:    jaundice  COMMENTS:       PROCEDURE: [place]  - US ABDOMEN GENERAL SURVEY  - Sep 29 2013  9:21AM     RESULT: The liver is enlarged. It measures 25 cm in the midclavicular   line. Its echotexture is increased consistent with fatty infiltration.   There is no focal mass or ductal dilation. As best as can be determined   portal venous flow remains normal in direction toward the liver. There is   mild splenomegaly with the maximal dimension of the spleen measuring 14.7   millimeters. Bowel gas limits evaluation of the pancreas. The gallbladder   exhibits no evidence of stones or sludge. There is gallbladder wall   thickening and a small amount of pericholecystic fluid. Yet, there is no   positive sonographic Murphy's sign. The common bile duct measures 4.2 mm   in diameter.  Bowel gas limits evaluation of the abdominal aorta. Only the proximal   aorta is demonstrated and it exhibits normal caliber at 2.2 cm. The   kidneys demonstrate normal echotexture and contour with no evidence of   stones or obstruction. No ascites is demonstrated.    IMPRESSION:    1. There is hepatosplenomegaly. There are fatty infiltrative changes of   the liver. There is no focal hepatic mass.  2. There is gallbladder wall thickening and a small amount of   pericholecystic fluid without evidence of gallstones or positive   sonographic Murphy's sign. This may reflect but chronic cholecystitis.  3. Evaluation of the pancreas and abdominal aorta is limited by bowel gas.  4. The kidneys are normal in appearance.    Followup abdominal CT scanning would be of value.   Dictation Site: 2        Verified By: DAVID A. Martinique, M.D., MD   Assessment/Plan:  Assessment/Plan:  Assessment 1) jaundice.  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 case discussed with hepatology at [facility] unit.  will start prednisone 40 mg po daily.  continue daily lft and pt   Plan as above   Electronic Signatures: Loistine Simas (MD)  (Signed 20-Oct-14 21:55)  Authored:  Chief Complaint, VITAL SIGNS/ANCILLARY NOTES, Brief Assessment, Lab Results, Radiology Results, Assessment/Plan   Last Updated: 20-Oct-14 21:55 by Loistine Simas (MD)

## 2015-04-05 NOTE — H&P (Signed)
PATIENT NAME:  Seth GeneraDAVIS, [name] MR#:  [id number] DATE OF BIRTH:  1971-02-19  DATE OF ADMISSION:  10/07/2013  This is Dr. Randol KernElgergawy dictating history and physical on    ____________________________ Starleen Armsawood S. Isadora Delorey, MD dse:NTS D: 10/07/2013 03:02:11 ET T: 10/07/2013 03:58:50 ET JOB#: [id number]  cc: Starleen Armsawood S. Fardeen Steinberger, MD, <Dictator> Zenora Karpel Teena IraniS Nella Botsford MD ELECTRONICALLY SIGNED 10/07/2013 6:44

## 2015-04-05 NOTE — Consult Note (Signed)
Chief Complaint:  Subjective/Chief Complaint seen for abnormal lfts.  tolerating po, had a bm, no n or abdominal apin.  continues with distended abdomen/ascites/ obvious jaundice.   VITAL SIGNS/ANCILLARY NOTES: **Vital Signs.:   18-Oct-14 13:08  Vital Signs Type Routine  Temperature Temperature (F) 98.4  Temperature Source oral  Pulse Pulse 104  Respirations Respirations 20  Systolic BP Systolic BP 128  Diastolic BP (mmHg) Diastolic BP (mmHg) 77  Mean BP 94  Pulse Ox % Pulse Ox % 94  Pulse Ox Activity Level  At rest  Oxygen Delivery Room Air/ 21 %   Brief Assessment:  Cardiac Regular   Respiratory clear BS   Gastrointestinal details normal Bowel sounds normal  ascites, not as tense as yesterday.  non-tender,   Lab Results: Hepatic:  17-Oct-14 04:32   Bilirubin, Total  17.6  Bilirubin, Direct  13.60 (Result(s) reported on 29 Sep 2013 at 03:59PM.)  Alkaline Phosphatase  227  SGPT (ALT) 24  SGOT (AST)  89  18-Oct-14 02:58   Bilirubin, Total  17.0  Bilirubin, Direct  13.50 (Result(s) reported on 30 Sep 2013 at 03:58AM.)  Alkaline Phosphatase  208  SGPT (ALT) 22  SGOT (AST)  83  Total Protein, Serum  5.3  Albumin, Serum  1.4  General Ref:  17-Oct-14 04:32   Hepatitis C Virus Antibody ========== TEST NAME ==========  ========= RESULTS =========  = REFERENCE RANGE =  HCV ANTIBODY  HCV Antibody Hep C Virus Ab                  [   <0.1 s/co ratio      ]           0.0-0.9                                                  Negative:     < 0.8                                             Indeterminate: 0.8 - 0.9                                                  Positive:     > 0.9                                                                      .                  In order to reduce the incidence of a false positive                  result, the CDC recommends that all s/co ratios                  between 1.0 and 10.9 be confirmed by a  more specific  supplemental or PCR testing. [facility] offers HCV Ab                 w/Reflex to Verification test [phone].               [facility]            No: [id number]           [address], [place], [place] [address]           Mila Homer, MD         [phone]   Result(s) reportedon 30 Sep 2013 at 09:18AM.  HIV Antibodies ========== TEST NAME ==========  ========= RESULTS =========  = REFERENCE RANGE =  HIV 1/2 AB W/CONF WB  Panel [id number] HIV 1/O/2 Abs-Index Value       [   <1.00                ]             <1.00 Index Value: Specimen reactivity relative to the negative cutoff. HIV 1/O/2 Abs, Qual             [   Non Reactive         ]      Non Reactive               [facility]            No: [id number]           [address] [place], [place] [address]           Mila Homer, MD [phone]   Result(s) reported on 30 Sep 2013 at 09:18AM.  Hepatitis Profile I ========== TEST NAME ==========  ========= RESULTS =========  = REFERENCE RANGE =  HEPATITIS PROFILE I  Hepatitis, Diagnostic (Prof I) Hep A Ab, IgM                   [   Negative             ]          Negative HBsAg Screen                    [   Negative             ]          Negative Hep B Core Ab, IgM              [   Negative             ]          Negative               [facility]             No: [id number]           [address], [place], [place] [address]           Mila Homer, MD         [phone]   Result(s) reported on 30 Sep 2013 at 09:18AM.    19:08   Hepatitis A Antibody, Total ========== TEST NAME ==========  ========= RESULTS =========  = REFERENCE RANGE =  HEPATITIS A ANTIBDY,TOTL  Hep A Ab, Total Hep A Ab, Total                 [   Negative             ]  Negative               [facility]   No: [id number]           [address], [place], [place] [address]           Mila Homer, MD         [phone]   Result(s)  reported on 30 Sep 2013 at 07:48AM.  Hepatitis C Virus Antibody ========== TEST NAME ==========  ========= RESULTS =========  = REFERENCE RANGE =  HCV ANTIBODY  HCV Antibody Hep C Virus Ab                  [   <0.1 s/co ratio      ]           0.0-0.9                                                  Negative:     < 0.8                                             Indeterminate: 0.8 - 0.9                                                  Positive:     > 0.9                                                                      .                  In order to reduce the incidence of a false positive                  result, the CDC recommends that all s/co ratios                  between 1.0 and 10.9 be confirmed by a more specific                  supplemental or PCR testing. [facility] offers HCV Ab                 w/Reflex to Verification test [phone].               [facility]            No: [id number]           [address], [place], [place] [address]           Mila Homer, MD         [phone]   Result(s) reportedon 30 Sep 2013 at 07:48AM.  HBsAg ========== TEST NAME ==========  ========= RESULTS =========  = REFERENCE RANGE =  HEPATITIS B SURFACE AG  HBsAg Screen HBsAg Screen                    [  Negative             ]          Negative               [facility]            No: [id number]           [address] [place], [place] [address]           Mila Homer, MD         [phone]   Result(s) reported on 30 Sep 2013 at 07:48AM.  Hepatitis B Surface Antibody, Qual ========== TEST NAME ==========  ========= RESULTS =========  = REFERENCE RANGE =  HEPATITIS B SURF.AB,QUAL  Hep B Surface Ab Hep B Surface Ab, Qual          [   Non Reactive         ]                                                Non Reactive: Inconsistent with immunity,                                            less than 10 mIU/mL                              Reactive:      Consistent with immunity,                                            greater than 9.9 mIU/mL               [facility]            No: [id number]           [address] [place], [place] [address]           Mila Homer, MD         [phone]   Result(s) reported on 30 Sep 2013 at 07:48AM.  Routine Chem:  18-Oct-14 02:58   Result Comment PLATELETS - SLIGHT PLATELET CLUMPING IN SPECIMEN. ACTUAL  - NUMERICAL COUNT MAY BE SOMEWHAT HIGHER THAN  - THE REPORTED VALUE.  Result(s) reported on 30 Sep 2013 at 07:49AM.  Routine Coag:  17-Oct-14 16:47   INR 1.5 (INR reference interval applies to patients on anticoagulant therapy. A single INR therapeutic range for coumarins is not optimal for all indications; however, the suggested range for most indications is 2.0 - 3.0. Exceptions to the INR Reference Range may include: Prosthetic heart valves, acute myocardial infarction, prevention of myocardial infarction, and combinations of aspirin and anticoagulant. The need for a higher or lower target INR must be assessed individually. Reference: The Pharmacology and Management of the Vitamin K  antagonists: the seventh ACCP Conference on Antithrombotic and Thrombolytic Therapy. Chest.2004 Sept:126 (3suppl): L7870634. A HCT value >55% may artifactually increase the PT.  In one study,  the increase was an average of 25%. Reference:  "Effect on Routine and Special Coagulation Testing Values of Citrate Anticoagulant Adjustment  in Patients with High HCT Values." American Journal of Clinical Pathology 2006;126:400-405.)  18-Oct-14 02:58   Prothrombin  18.4  INR 1.5 (INR reference interval applies to patients on anticoagulant therapy. A single INR therapeutic range for coumarins is not optimal for all indications; however, the suggested range for most indications is 2.0 - 3.0. Exceptions to the INR Reference Range may include: Prosthetic heart valves, acute myocardial infarction,  prevention of myocardial infarction, and combinations of aspirin and anticoagulant. The need for a higher or lower target INR must be assessed individually. Reference: The Pharmacology and Management of the Vitamin K  antagonists: the seventh ACCP Conference on Antithrombotic and Thrombolytic Therapy. Chest.2004 Sept:126 (3suppl): L7870634. A HCT value >55% may artifactually increase the PT.  In one study,  the increase was an average of 25%. Reference:  "Effect on Routine and Special Coagulation Testing Values of Citrate Anticoagulant Adjustment in Patients with High HCT Values." American Journal of Clinical Pathology 2006;126:400-405.)  Routine Hem:  18-Oct-14 02:58   WBC (CBC)  16.1  RBC (CBC)  2.74  Hemoglobin (CBC)  9.7  Hematocrit (CBC)  28.1  Platelet Count (CBC) 273  MCV  102  MCH  35.5  MCHC 34.8  RDW  15.8  Bands 8  Segmented Neutrophils 71  Lymphocytes 9  Monocytes 12  Diff Comment 1 ANISOCYTOSIS  Diff Comment 2 POLYCHROMASIA  Diff Comment 3 TOXIC GRANULATION  Diff Comment 4 PLTS VARIED IN SIZE  Diff Comment 5 TOXIC VACUOLIZATION  Diff Comment 6 DOHLE BODIES  Result(s) reported on 30 Sep 2013 at 07:49AM.   Radiology Results: [place]:    17-Oct-14 09:21, US Abdomen General Survey  US Abdomen General Survey   REASON FOR EXAM:    jaundice  COMMENTS:       PROCEDURE: [place]  - US ABDOMEN GENERAL SURVEY  - Sep 29 2013  9:21AM     RESULT: The liver is enlarged. It measures 25 cm in the midclavicular   line. Its echotexture is increased consistent with fatty infiltration.   There is no focal mass or ductal dilation. As best as can be determined   portal venous flow remains normal in direction toward the liver. There is   mild splenomegaly with the maximal dimension of the spleen measuring 14.7   millimeters. Bowel gas limits evaluation of the pancreas. The gallbladder   exhibits no evidence of stones or sludge. There is gallbladder wall   thickening and a small amount of  pericholecystic fluid. Yet, there is no   positive sonographic Murphy's sign. The common bile duct measures 4.2 mm   in diameter.  Bowel gas limits evaluation of the abdominal aorta. Only the proximal   aorta is demonstrated and it exhibits normal caliber at 2.2 cm. The   kidneys demonstrate normal echotexture and contour with no evidence of   stones or obstruction. No ascites is demonstrated.    IMPRESSION:    1. There is hepatosplenomegaly. There are fatty infiltrative changes of   the liver. There is no focal hepatic mass.  2. There is gallbladder wall thickening and a small amount of   pericholecystic fluid without evidence of gallstones or positive   sonographic Murphy's sign. This may reflect but chronic cholecystitis.  3. Evaluation of the pancreas and abdominal aorta is limited by bowel gas.  4. The kidneys are normal in appearance.    Followup abdominal CT scanning would be of value.   Dictation Site: 2  Verified By: DAVID A. [place], M.D., MD   Assessment/Plan:  Assessment/Plan:  Assessment 1) elevated lfts in the setting of h/o etoh abuse and valproic acid use.  labs stable. 2) some constipation   Plan 1) daily lfts and pt 2) miralax.   3) checking further labs re chronic liver disease. alpha 1 ATP, cerruloplasmin, iron studies/ferrritin, alpha fetoprotein following.   Electronic Signatures: Barnetta Chapel (MD)  (Signed 18-Oct-14 13:53)  Authored: Chief Complaint, VITAL SIGNS/ANCILLARY NOTES, Brief Assessment, Lab Results, Radiology Results, Assessment/Plan   Last Updated: 18-Oct-14 13:53 by Barnetta Chapel (MD)

## 2015-04-05 NOTE — Consult Note (Signed)
Chief Complaint:  Subjective/Chief Complaint seen for jaundice.  paitnet continues with jaundice, near tense ascited, mild lower abdominal discomfort.  had 2 bm small today,  no nausea or vomiting.   VITAL SIGNS/ANCILLARY NOTES: **Vital Signs.:   19-Oct-14 13:39  Vital Signs Type Routine  Celsius 37.1  Temperature Source oral  Pulse Pulse 106  Respirations Respirations 20  Systolic BP Systolic BP 138  Diastolic BP (mmHg) Diastolic BP (mmHg) 80  Mean BP 99  Pulse Ox % Pulse Ox % 95  Pulse Ox Activity Level  At rest  Oxygen Delivery Room Air/ 21 %   Brief Assessment:  Cardiac Regular   Respiratory clear BS   Gastrointestinal details normal Bowel sounds normal  near tense ascites, non-tender/minimal tenderness.   Lab Results: Hepatic:  19-Oct-14 04:24   Bilirubin, Total  17.6  Bilirubin, Direct  14.00 (Result(s) reported on 01 Oct 2013 at 05:50AM.)  Alkaline Phosphatase  217  SGPT (ALT) 19  SGOT (AST)  80  Total Protein, Serum  5.2  Albumin, Serum  1.3  Routine Chem:  19-Oct-14 04:24   Iron Binding Capacity (TIBC)  105  Unbound Iron Binding Capacity 84  Iron, Serum  21  Iron Saturation 20 (Result(s) reported on 01 Oct 2013 at 05:41AM.)  Routine Coag:  19-Oct-14 04:24   Prothrombin  18.8  INR 1.6 (INR reference interval applies to patients on anticoagulant therapy. A single INR therapeutic range for coumarins is not optimal for all indications; however, the suggested range for most indications is 2.0 - 3.0. Exceptions to the INR Reference Range may include: Prosthetic heart valves, acute myocardial infarction, prevention of myocardial infarction, and combinations of aspirin and anticoagulant. The need for a higher or lower target INR must be assessed individually. Reference: The Pharmacology and Management of the Vitamin K  antagonists: the seventh ACCP Conference on Antithrombotic and Thrombolytic Therapy. Chest.2004 Sept:126 (3suppl): L7870634. A HCT value >55%  may artifactually increase the PT.  In one study,  the increase was an average of 25%. Reference:  "Effect on Routine and Special Coagulation Testing Values of Citrate Anticoagulant Adjustment in Patients with High HCT Values." American Journal of Clinical Pathology 2006;126:400-405.)   Radiology Results: [place]:    17-Oct-14 09:21, US Abdomen General Survey  US Abdomen General Survey   REASON FOR EXAM:    jaundice  COMMENTS:       PROCEDURE: [place]  - US ABDOMEN GENERAL SURVEY  - Sep 29 2013  9:21AM     RESULT: The liver is enlarged. It measures 25 cm in the midclavicular   line. Its echotexture is increased consistent with fatty infiltration.   There is no focal mass or ductal dilation. As best as can be determined   portal venous flow remains normal in direction toward the liver. There is   mild splenomegaly with the maximal dimension of the spleen measuring 14.7   millimeters. Bowel gas limits evaluation of the pancreas. The gallbladder   exhibits no evidence of stones or sludge. There is gallbladder wall   thickening and a small amount of pericholecystic fluid. Yet, there is no   positive sonographic Murphy's sign. The common bile duct measures 4.2 mm   in diameter.  Bowel gas limits evaluation of the abdominal aorta. Only the proximal   aorta is demonstrated and it exhibits normal caliber at 2.2 cm. The   kidneys demonstrate normal echotexture and contour with no evidence of   stones or obstruction. No ascites is demonstrated.  IMPRESSION:    1. There is hepatosplenomegaly. There are fatty infiltrative changes of   the liver. There is no focal hepatic mass.  2. There is gallbladder wall thickening and a small amount of   pericholecystic fluid without evidence of gallstones or positive   sonographic Murphy's sign. This may reflect but chronic cholecystitis.  3. Evaluation of the pancreas and abdominal aorta is limited by bowel gas.  4. The kidneys are normal in  appearance.    Followup abdominal CT scanning would be of value.   Dictation Site: 2        Verified By: DAVID A. SwazilandJORDAN, M.D., MD   Assessment/Plan:  Assessment/Plan:  Assessment 1) jaundice.  history of long term etoh abuse, possibly less over the past 2 months.  was started on valproic acid about 2 months ago because of h/o seizure d/o.  probable hepatotoxicity with this combination of pre-existing liver dysfunction/injury and drug induced liver injury.   Plan 1) continue current, [facility]   Electronic Signatures: Barnetta ChapelSkulskie, Martin (MD)  (Signed 19-Oct-14 15:07)  Authored: Chief Complaint, VITAL SIGNS/ANCILLARY NOTES, Brief Assessment, Lab Results, Radiology Results, Assessment/Plan   Last Updated: 19-Oct-14 15:07 by Barnetta ChapelSkulskie, Martin (MD)

## 2015-04-05 NOTE — Consult Note (Signed)
Chief Complaint:  Subjective/Chief Complaint Please see full Gi consult and brief consult note.  Patietn seen and examined, chart reviewed.  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  Continuew on po steroids. Admitted for edema.  likely related to some increased intake in the setting of hypoalbuminemia from liver disease.  Case discussed with patient.  He is feeling better, less peripheral edema, abdomen less tense, minimal abdominal discomfort.  No asterixis. Diuretic increased, will need to optimize nutrition.  Continue po steroid at current dose of 40 mg prednisone daily.  Following.   Electronic Signatures: Barnetta ChapelSkulskie, Martin (MD)  (Signed 27-Oct-14 17:38)  Authored: Chief Complaint   Last Updated: 27-Oct-14 17:38 by Barnetta ChapelSkulskie, Martin (MD)

## 2015-04-05 NOTE — Consult Note (Signed)
Chief Complaint:  Subjective/Chief Complaint please see full GI consult. Patietn seen and examined, chart reviewed.  Patient admitted with increasing ascites and progressive jaundice and fatigue over the course of 2 weeks.  Paiten with h/o etoh abuse for many years, but was started on depakote about 2 months ago.  Patient with mixed pattern of liver enzymes with significant elebation of bili, probably consistant with multifactorial etiology, etoh and likely depakote drug induced liver enzyme abnormality.  Depakote held.  Recommend daily lfts and PT.  Will follow.   Electronic Signatures: Barnetta ChapelSkulskie, Harneet Noblett (MD)  (Signed 17-Oct-14 21:58)  Authored: Chief Complaint   Last Updated: 17-Oct-14 21:58 by Barnetta ChapelSkulskie, Lilianna Case (MD)

## 2015-04-05 NOTE — Consult Note (Signed)
Chief Complaint:  Subjective/Chief Complaint patient feeling perhaps a little better today. continues with pain in the ruq.  no nausea, tolerating po.   VITAL SIGNS/ANCILLARY NOTES: **Vital Signs.:   21-Oct-14 14:55  Vital Signs Type Routine  Temperature Temperature (F) 98.2  Celsius 36.7  Temperature Source oral  Respirations Respirations 20  Systolic BP Systolic BP 284  Diastolic BP (mmHg) Diastolic BP (mmHg) 73  Mean BP 87  Pulse Ox % Pulse Ox % 93  Pulse Ox Activity Level  At rest  Oxygen Delivery Room Air/ 21 %   Brief Assessment:  Cardiac Regular   Respiratory clear BS   Gastrointestinal details normal distended, mild generalized tenderness, no rebound.   Lab Results:  BF Analysis:  21-Oct-14 09:50   Albumin, Body Fluid < 0.6 (Result(s) reported on 03 Oct 2013 at 11:20AM.)  Body Fluid Source (CC) ABDOMINAL FLUID  Color - (BF) YELLOW  Clarity (BF) CLEAR  NCC  (nucleated cell count) 118  Neutrophils  (BF) 45  Lymphocytes  (BF) 15  Monocytes/Macrophages  (BF) 40  Eosinophil  (BF) 0  Basophil  (BF) 0  Other Cells  (BF) 0 (Result(s) reported on 03 Oct 2013 at 01:57PM.)  Hepatic:  21-Oct-14 07:07   Bilirubin, Direct  13.60 (Result(s) reported on 03 Oct 2013 at 08:16AM.)  Bilirubin, Total  16.6  Alkaline Phosphatase  207  SGPT (ALT) 21  SGOT (AST)  79  Total Protein, Serum  5.0  Albumin, Serum  1.2  Routine Chem:  21-Oct-14 07:07   Glucose, Serum  111  BUN 10  Creatinine (comp) 0.76  Sodium, Serum  133  Potassium, Serum 3.8  Chloride, Serum 100  CO2, Serum 28  Calcium (Total), Serum  8.2  Osmolality (calc) 266  eGFR (African American) >60  eGFR (Non-African American) >60 (eGFR values <73m/min/1.73 m2 may be an indication of chronic kidney disease (CKD). Calculated eGFR is useful in patients with stable renal function. The eGFR calculation will not be reliable in acutely ill patients when serum creatinine is changing rapidly. It is not useful in   patients on dialysis. The eGFR calculation may not be applicable to patients at the low and high extremes of body sizes, pregnant women, and vegetarians.)  Anion Gap  5  Routine Coag:  21-Oct-14 07:07   Prothrombin  19.0  INR 1.6 (INR reference interval applies to patients on anticoagulant therapy. A single INR therapeutic range for coumarins is not optimal for all indications; however, the suggested range for most indications is 2.0 - 3.0. Exceptions to the INR Reference Range may include: Prosthetic heart valves, acute myocardial infarction, prevention of myocardial infarction, and combinations of aspirin and anticoagulant. The need for a higher or lower target INR must be assessed individually. Reference: The Pharmacology and Management of the Vitamin K  antagonists: the seventh ACCP Conference on Antithrombotic and Thrombolytic Therapy. CXLKGM.0102Sept:126 (3suppl): [id number] A HCT value >55% may artifactually increase the PT.  In one study,  the increase was an average of 25%. Reference:  "Effect on Routine and Special Coagulation Testing Values of Citrate Anticoagulant Adjustment in Patients with High HCT Values." American Journal of Clinical Pathology 2006;126:400-405.)   Assessment/Plan:  Assessment/Plan:  Assessment 1) abnormal lfts-multifactorial-ETOH hepatopathy, valproic acid .  some improvement of labs today.  repeat [place] showing some moderte lower abdominal ascites, but lab on paracentesis showing low albumin, low cell count, most consistant with portal source in regard to etoh abuse.   Plan 1) continue current, will  add po cipro twice a week for sbp prophylaxis 2) if lfts continue to improve over the next couple days, could then be followed up on o/p.   Electronic Signatures: Loistine Simas (MD)  (Signed 21-Oct-14 18:12)  Authored: Chief Complaint, VITAL SIGNS/ANCILLARY NOTES, Brief Assessment, Lab Results, Assessment/Plan   Last Updated: 21-Oct-14 18:12  by Loistine Simas (MD)

## 2015-04-05 NOTE — Consult Note (Signed)
PATIENT NAME:  Seth [name], HERDA MR#:  [id number] DATE OF BIRTH:  [birth year]  DATE OF CONSULTATION:  09/29/2013  CONSULTING PHYSICIAN:  Keturah Barre, NP  HISTORY OF PRESENT ILLNESS: Appreciate consult for 44 year old Caucasian man admitted with elevated LFTs, jaundice, for evaluation of same. Gives a history of heavy alcohol consumption since he was 44 years old, up to a 12-pack a day. Used cocaine and marijuana in the past but states none recently. States he quit alcohol about 2 months ago. No prior history of jaundice or ascites. States no history of elevated LFTs in the past. Attributes his illness at present to Depakote use for seizures. This has been stopped this admission. Had neuro consult today with Dr. Malvin Johns. Reports over the last couple of weeks onset of abdominal cramps, distention and jaundice. Urine turned dark. No other GI complaints. Takes 20 mg of Prilosec daily for reflux. Liver history significant also for incarceration, tattoos, single piercing. No military or healthcare service, foreign travel, dialysis, blood transfusions, viral hepatitis, family history of liver disease. Ultrasound with hepatosplenomegaly, fatty liver.   PAST MEDICAL HISTORY: Hypertension, seizure disorder, alcohol abuse.   SOCIAL HISTORY: No current smoking. Quit EtOH 2 to 3 months ago. No recent cocaine or marijuana. Denies STDs.   PAST SURGICAL HISTORY: None.   FAMILY HISTORY: Significant for hypertension.   ALLERGIES: None.   HOME MEDICATIONS: Prilosec 20 mg p.o. daily, Valium 5 mg p.o. t.i.d. p.r.n., Depakote 125 mg p.o. t.i.d., chlorthalidone 125 mg p.o. daily.   REVIEW OF SYSTEMS:  Ten systems reviewed, unremarkable other than what is noted above.   LABORATORY DATA: Most recent glucose 91, BUN 7, creatinine 0.69, sodium 131, potassium 3.1, chloride of 95, GFR greater than 60. VLDL 46, LDL 112. Calcium 8.1, mag 1.8. Cholesterol 164, triglycerides 232, HDL 6. Ammonia 50. Hemoglobin A1c 4.4. Total  protein 6, serum albumin 1.6, total bilirubin 17.6, direct bilirubin 13.6, alkaline phosphatase 227, AST 89, ALT 24. TSH 3.92. Urine drug screen significant for benzodiazepines and opiates. WBC 19.3, hemoglobin 10.9, hematocrit 31.3, platelet count 267; red cells are macrocytic. PT 18.0, INR 1.5.   RADIOLOGICAL DATA: Ultrasound with hepatosplenomegaly, fatty changes in the liver. There is no mass. No evidence of gallstones or positive sonographic Murphy sign. Common bile duct of 4.2 mm in diameter, which is normal.   PHYSICAL EXAMINATION: VITAL SIGNS: Most recent temperature 97.6, pulse 100, respiratory rate 20, blood pressure 125/74, SaO2 of 93%.  GENERAL: Comfortable-appearing man lying in bed in no acute distress.  HEENT: Normocephalic, atraumatic. Sclerae icteric. Oral mucous membranes are pink and moist.  NECK: Supple. No JVD, lymphadenopathy.  CHEST: Respirations eupneic. Lungs CTAB.  CARDIAC: S1, S2, RRR. No MRG. Trace lower extremity edema.  ABDOMEN: Slightly distended. Bowel sounds x 4. Soft. Mild caput medusa noted. No guarding, rigidity, tenderness, peritoneal signs. Difficult to appreciate hepatosplenomegaly on palpation.  SKIN: Warm, dry, jaundiced. Several tattoos. Mild ecchymosis.  NEUROLOGICAL: Alert, oriented x 3. Cranial nerves II through XII intact. Minimal asterixis.  PSYCHIATRIC: Pleasant, calm, cooperative.   IMPRESSION AND PLAN: Elevated LFTs, jaundice, ascites: May be multifactorial. Should avoid any further substances that harm the liver. Do strongly recommend sobriety. Will order viral liver serologies. Strongly advise 2 gram sodium diet. May need diuretics in the future for management of ascites. Further recommendations are to follow.   These services were provided by Vevelyn Pat, MSN, [facility], in collaboration with Christena Deem, MD, with whom I have discussed this patient in full.  Thank you very much for this consult.    ____________________________ Keturah Barrehristiane H. Lance Huaracha, NP chl:jm D: 09/29/2013 18:24:17 ET T: 09/29/2013 19:17:25 ET JOB#: [id number]  cc: Keturah Barrehristiane H. Miniya Miguez, NP, <Dictator> Eustaquio MaizeHRISTIANE H Sharnita Bogucki FNP ELECTRONICALLY SIGNED 10/04/2013 14:01

## 2015-04-05 NOTE — H&P (Signed)
PATIENT NAME:  Seth [name], Seth [name] MR#:  [id number] DATE OF BIRTH:  1971/10/13  DATE OF ADMISSION:  10/07/2013  REFERRING PHYSICIAN:  Dr. Kerman Passey.   PRIMARY CARE PHYSICIAN:  Dr. Elijio Miles.  CHIEF COMPLAINT:  Leg swelling, scrotal swelling.   HISTORY OF PRESENT ILLNESS:  This is a 44 year old male who was discharged yesterday from [facility] for new diagnosis of liver failure though due to be history of alcohol abuse, and valproic acid, the patient was seen by GI, Dr. Gustavo Lah during previous hospitalization where he was discharged on by mouth prednisone, as well the patient is known to have history of seizures where he was seen by neurology where they started him on gabapentin 300 mg 3 times a day for his seizures, the patient presents today with complaints of worsening lower extremity edema and scrotal edema, the patient reports he has been ambulatory all day long when he started to notice by the end of the day some scrotal swelling.  On presentation to ED, the patient did not have any scrotal swelling, the patient's labs were significant for leukocytosis at 34,000, the patient's labs on discharge on the 23rd were 24,000,  this is thought to be due to his prednisone, the patient denies any fever or chills, the patient's urinalysis came back negative, denies any cough, any fever, the patient's sodium level was at 128, the patient had diagnostic tap of 10 mL total by ED physician which did show a total of 53 neutrophils, the patient was discharged on by mouth Cipro once a week just as a prophylaxis for spontaneous bacterial peritonitis, hospitalist service were requested to admit the patient for further evaluation for his leukocytosis.   PAST MEDICAL HISTORY:  1.  Hypertension.  2.  Seizure disorder.  3.  History of alcohol abuse.  4.  Recent diagnosis of liver failure thought to be due to alcohol abuse and valproic acid.   SOCIAL HISTORY:  The patient has no smoking, quit drinking alcohol before  three months, used to have history of cocaine abuse in the past, but nothing recent.   PAST SURGICAL HISTORY:  None.  FAMILY HISTORY:  Significant for hypertension.   ALLERGIES:  No known drug allergies.   HOME MEDICATIONS: 1.  Prednisone 40 mg oral daily.  2.  Aldactone 25 mg oral daily.  3.  Gabapentin 300 mg oral 3 times a day.  4.  Valium 5 mg oral 3 times a day as needed.  5.  Chlorthalidone 25 mg oral daily.  6.  Lasix 20 mg oral daily.  7.  Prilosec 20 mg oral 2 times a day.  8.  Ciprofloxacin 250 mg oral once a week.   REVIEW OF SYSTEMS: CONSTITUTIONAL:  The patient denies fever, chills, fatigue, weakness, weight gain, weight loss.  EYES:  Denies blurry vision, double vision, inflammation, glaucoma.  EARS, NOSE, THROAT:  Denies tinnitus, ear pain, hearing loss, epistaxis.  RESPIRATORY:  Denies cough, wheezing, hemoptysis, dyspnea, COPD.  CARDIOVASCULAR:  Denies chest pain, arrhythmia, palpitations, syncope.  Complains of lower extremity edema.  GASTROINTESTINAL:  Denies nausea, vomiting, diarrhea, constipation, hematemesis, melena, rectal bleed.  Has complaints of jaundice and abdominal pain.  GENITOURINARY:  Denies dysuria, hematuria, renal colic.  ENDOCRINE:  Denies polyuria, polydipsia, heat or cold intolerance.  HEMATOLOGY:  Denies anemia, easy bruising.  INTEGUMENTARY:  Denies acne, rash or skin lesions.  Complains of jaundice.  MUSCULOSKELETAL:  Denies any gout, arthritis or cramps.  NEUROLOGIC:  Denies CVA, TIA, ataxia, headache, tremors.  PSYCHIATRIC:  Denies anxiety, insomnia, bipolar disorder.   PHYSICAL EXAMINATION: VITAL SIGNS:  Temperature 98.2, pulse 81, respiratory rate 20, blood pressure 114/64, saturating 94% on room air.  GENERAL:  Jaundiced male appears much older than his stated age, looks comfortable in bed, in no apparent distress.  HEENT:  Head atraumatic, normocephalic.  Pupils equal, reactive to light.  Pink conjunctivae.  Has icteric and jaundiced  sclerae, jaundiced skin.  Moist oral mucosa.  NECK:  Supple.  No thyromegaly.  No JVD.  CHEST:  Good air entry bilaterally.  No wheezing, rales or rhonchi.  CARDIOVASCULAR:  S1, S2 heard.  No rubs, murmur or gallops.  ABDOMEN:  Distended, mild tenderness to palpation, but no rebound, no guarding. EXTREMITIES:  +2 edema bilaterally.  No clubbing.  No cyanosis.  Pedal pulses felt bilaterally.  PSYCHIATRIC:  Appropriate affect.  Awake, alert x 3.  Intact judgment and insight.   NEUROLOGIC:  Cranial nerves grossly intact.  Strength 5 out of 5.  No focal deficits.  SKIN:  Jaundiced.  No rash.  LYMPHATICS:  No cervical or supraclavicular lymphadenopathy.   PERTINENT LABORATORY DATA:  Glucose 146, BUN 15, creatinine 0.92, sodium 128, potassium 3.6, chloride 93, CO2 27.  Ammonia 44, total protein 5.7, albumin 1.6, total bili was 10.9, alk phos 259, AST 300, ALT 95.  White blood cells 34,000, hemoglobin 10.4, hematocrit 29.9, platelets 416.  Paracentesis fluid showing 53 neutrophils, 32 lymphocytes.  Urinalysis is negative.   ASSESSMENT AND PLAN: 1.  Liver failure with elevated liver function tests, this is most likely due to his alcohol abuse and valproic acid, the patient has already evaluated by gastroenterology during his last visit so we will continue him on Aldactone, Lasix, we will resume him back on lactulose, especially with mild elevated ammonia level, the patient had diagnostic paracentesis in the ED negative for spontaneous bacterial peritonitis, we will continue him on prednisone, we will consult gastroenterology to see if there is any further recommendation.  2.  Leukocytosis.  This is most likely steroid-induced, but we will check septic work-up, so far he is ruled out for urinary tract infection or spontaneous bacterial peritonitis, we will send blood cultures, we will hold on starting him on any antibiotics.  3.  History of seizures.  No recurrence on Neurontin.  4.  Hypertension.  Blood  pressure acceptable.  Continue with hydrochlorothiazide.  5.  Abdominal pain.  This is most likely related to ascites, the patient does not have spontaneous bacterial peritonitis, but he will need therapeutic paracentesis to relieve his abdominal pain, so we will have ultrasound-guided paracentesis requested by interventional radiology.  6.  DVT prophylaxis.  SubQ heparin.  7.  GI prophylaxis, on PPI, especially he is on large dose steroids.  8.  CODE STATUS:  FULL CODE.   Total time spent on admission and patient care 55 minutes.     ____________________________ Albertine Patricia, MD dse:ea D: 10/07/2013 03:20:00 ET T: 10/07/2013 04:24:24 ET JOB#: [id number]  cc: Albertine Patricia, MD, <Dictator> Winifred Bodiford Graciela Husbands MD ELECTRONICALLY SIGNED 10/07/2013 6:44

## 2015-04-05 NOTE — Consult Note (Signed)
Brief Consult Note: Diagnosis: liver failure.   Patient was seen by consultant.   Consult note dictated.   Comments: Appreciate consult for 44 y/o caucaisan man for evaluation of elevated lfts. Was recently hospitalized with this and it was determined to be in consequence of depakote use and etoh use. Was discharger 10/23 and then present to ED on Saturday with increasing lower extremity and abdominal swelling that started Friday. Both he and his wife state adherence to his discharge medications of aldactone, lasix, chlorthalidone, cipro, and prilosec. Was also [facility] DIL[facility].  Is currently on 40mg  po daily with plans to taper in about 3w. Reports also some mild RUQ discomfort. Denies further GI complaints. Was noted to have some mild elevation in his ammonia, but has not been on lactulose or xifaxan at home. Report 2-4 bowel mvts/d, without signs of blood.  States no difficulty with mentation. Does have some easy bruising. States he is following a low sodium diet and maintaining sobriety. Do note significantly improved bilirubin presently, lfts starting to improve from 10/21. PT/INR better. Note plans today for paracentesis for fluid removal due to findings of ascites on US: had diagnostic paracentesis in ED that was negative for SBP by report Impression and plan: DILI/alcoholic liver disease. Do recommend increasing Aldactone to 50mg  po daily, and continuing Lasix, chlorthalidone, Cipro, Prilosec as is. Also recommend electolyte correction and daily lft/pt-inr tests. Continue 2gm Na diet and sobriety.  Will asses NH4 today- may benefit from Xifaxan 550mg  po bid..  Electronic Signatures: Vevelyn PatLondon, Jahna Liebert H (NP)  (Signed 27-Oct-14 14:32)  Authored: Brief Consult Note   Last Updated: 27-Oct-14 14:32 by Keturah BarreLondon, Angelyna Henderson H (NP)

## 2015-04-05 NOTE — H&P (Signed)
PATIENT NAME:  Seth [name], RIGHI MR#:  [id number] DATE OF BIRTH:  [birth year]  DATE OF ADMISSION:  09/28/2013  PRIMARY CARE PHYSICIAN:  Dr. Ellsworth Lennox  REFERRING PHYSICIAN: Dr. Malvin Johns and Dr. Ellsworth Lennox   CHIEF COMPLAINT:  Abnormal liver function test today and jaundice for three days.   HISTORY OF PRESENT ILLNESS: A 44 year old Caucasian male with a history of hypertension, seizure disorder, alcohol abuse, was sent by his primary care physician, Dr. Ellsworth Lennox, due to abnormal liver function test in [facility] today. The patient is alert, awake, oriented, in no acute distress. According to the patient, the patient has had abdominal pain, cramps,  jaundice and abdominal distention for the past three days. The patient also has constipation, dark urine, poor appetite. The patient gained weight, 10 pounds for the past one month. The patient denies any travel history, no transmitted disease history. The patient was started with divalproex one month ago for seizure disorder. The patient said he had a seizure one month ago, then was started on this new medication. The patient was Keppra before. The patient denies any other symptoms.   PAST MEDICAL HISTORY: Hypertension, seizure disorder, alcohol abuse.   SOCIAL HISTORY: No smoking, quit alcohol drinking three months ago, use cocaine before, but denies any drug abuse this time. The patient denies any sexually transmitted disease and uses protection. Denies any HIV positive test before.   PAST SURGICAL HISTORY: None.   FAMILY HISTORY: Hypertension,   ALLERGIES: None.   HOME MEDICATIONS: Prilosec 20 mg p.o. daily, Valium 5 mg p.o. 3 times a day p.r.n. for anxiety, divalproex sodium 125 mg p.o. tablets 3 times a day, chlorthalidone 125 mg p.o. daily   REVIEW OF SYSTEMS:  CONSTITUTIONAL: The patient denies any fever or chills. No headache or dizziness, but has poor appetite and weakness.   EYES: No double vision, blurry vision, but has icterus.   HEENT: No  dysphagia, epistaxis or slurred speech.  CARDIOVASCULAR: No chest pain, palpitation, orthopnea, or nocturnal dyspnea, but has leg edema.  PULMONARY: No cough, sputum, shortness of breath or hemoptysis.  GASTROINTESTINAL: Positive for abdominal pain, nausea and vomiting once and jaundice. No melena or bloody stool. No diarrhea.  GENITOURINARY: No dysuria, hematuria, or incontinence, but has dark urine.  SKIN: No rash but has jaundice.  NEUROLOGY: No syncope, loss of consciousness or seizure.  HEMATOLOGY: No easy bruising or bleeding.   ENDOCRINE: No polyuria, polydipsia, heat or cold intolerance.   PHYSICAL EXAMINATION: VITAL SIGNS: Temperature 98.6, blood pressure 149/79, pulse 11, pulse 115, oxygen saturation 97% on room air.  GENERAL: The patient is alert, awake, and oriented, in no acute distress.  HEENT: Pupils round, equal and reactive to light and accommodation, has icterus in both eyes.   No discharge from ear or nose. Moist oral mucosa. Clear oropharynx.  NECK: Supple. No JVD or carotid bruit. No lymphadenopathy. No thyromegaly.  CARDIOVASCULAR: S1, S2 regular rate and rhythm. No murmurs or gallops.  PULMONARY: Bilateral air entry. No wheezing or rales. No use of accessory muscle to breathe.  ABDOMEN: Soft, distention, diffuse tenderness. Difficult to estimate whether patient has organomegaly. Ascites sign unable to estimate. Bowel sounds weak.  EXTREMITIES: Bilateral leg edema. No, clubbing or cyanosis, bilateral pedal pulses present.  SKIN: Jaundice. No rash.  NEUROLOGY: A and O x 3. No focal deficit. Power 5/5. Sensation intact. No tremor.   LABORATORY DATA: There is no current laboratory data. We will check CBC and complete metabolic panel, but the patient had abnormal  liver function test last August.    IMPRESSION: 1.  Jaundice, abnormal liver function tests, possibly due to seizure medication and alcohol abuse, but need to rule out hepatitis.  2.  Abdominal pain with nausea   3.  Possible liver cirrhosis need to rule out ascites.  4.  Hypertension.  5.  History of alcohol abuse.   PLAN OF TREATMENT: 1.  The patient is already admitted directly to the medical floor. We will keep nothing oral except medications. We will hold divalproex and check complete metabolic panel, lipase, ammonia level. Also, we will get an abdominal ultrasound and a GI consult.  2.  We will check hepatitis panels, HIV tests, urine drug screen pain.  3.  Start seizure and fall precautions.  4.  I discussed the patient's condition and plan of treatment with the patient and the patient's wife.   CODE STATUS:  The patient wants full code.   TIME SPENT: About 58 minutes    ____________________________ Shaune PollackQing Ryeleigh Santore, MD qc:cc D: 09/28/2013 19:03:00 ET T: 09/28/2013 20:10:58 ET JOB#: [id number]  cc: Shaune PollackQing Dhyan Noah, MD, <Dictator> Shaune PollackQING Savas Elvin MD ELECTRONICALLY SIGNED 10/02/2013 12:47

## 2015-04-05 NOTE — Discharge Summary (Signed)
PATIENT NAME:  Seth [name], Seth [name] MR#:  [id number] DATE OF BIRTH:  Mar 17, 1971  DATE OF ADMISSION:  10/07/2013  DATE OF DISCHARGE:  10/10/2013  ADMITTING PHYSICIAN:  Dr. Randol KernElgergawy  DISCHARGE PHYSICIAN:  Dr. Ellsworth Lennoxejan-Sie   PROCEDURES:  Ultrasound-guided paracentesis, which yielded zero amount of fluid.   CONSULTATIONS: Gastroenterology, Dr. Dow AdolphSkulskie   HOSPITAL COURSE: The patient was admitted through the Emergency Room, where he presented with leg and increased scrotal swelling. He also had marked leukocytosis and more severe elevation of his transaminases, which were almost normal at his previous hospitalization. The patient denied increased alcohol use following discharge. He was admitted to the medical floor. Please refer to the history and physical for further details. The patient was continued on his prednisone for his alcoholic and drug-induced hepatitis. He was diuresed with spironolactone. His white cell count was elevated but there was no focus of infection. The patient continued to complain of abdominal pain, for which no obvious cause could be found. This was treated with oxycodone, with symptomatic relief. Consultation was obtained with Dr. Marva PandaSkulskie, who recommended continuation of his oral steroids and other medications for his liver cirrhosis. The patient'Chijioke Lasser pain gradually resolved. His LFTs improved during his stay, and his prolactin was increased. The patient was discharged to home in satisfactory condition.   DISCHARGE MEDICATIONS: Prilosec 20 mg b.i.[name]., chlorthalidone 25 mg daily, Valium 5 mg t.i.[name]., prednisone 40 mg daily, gabapentin 300 mg t.i.[name]., )see med rec )20 mg once a day, ciprofloxacin 250 mg once a week, spironolactone 50 mg daily, Lactulose 10 grams 30 mL b.i.[name]., oxycodone 5 mg 4 times day as needed.   DISCHARGE INSTRUCTIONS:  Diet:  Hepatic diet, low sodium. Activity as tolerated. Follow up 1-2 weeks with Dr. Marva PandaSkulskie, 1-2 weeks with Dr. Ellsworth Lennoxejan-Sie.   Discharge process time spent:  35 minutes.     ____________________________ Silas FloodSheikh A. Ellsworth Lennoxejan-Sie, MD sat:mr [name]: 10/22/2013 11:59:46 ET T: 10/22/2013 18:42:38 ET JOB#: [id number]  cc: Sheikh A. Ellsworth Lennoxejan-Sie, MD, <Dictator> Charlesetta GaribaldiSHEIKH A TEJAN-SIE MD ELECTRONICALLY SIGNED 10/23/2013 13:33

## 2015-04-05 NOTE — Consult Note (Signed)
Chief Complaint:  Subjective/Chief Complaint seen for abnormal lfts.  doing well, tolerating po no n/v.  mild abdominal apin likely from abdominal distension form hepatosplenomegally and ascites   VITAL SIGNS/ANCILLARY NOTES: **Vital Signs.:   22-Oct-14 04:49  Vital Signs Type Routine  Temperature Temperature (F) 97.6  Celsius 36.4  Temperature Source oral  Respirations Respirations 18  Systolic BP Systolic BP 562  Diastolic BP (mmHg) Diastolic BP (mmHg) 75  Mean BP 90  Pulse Ox % Pulse Ox % 98  Pulse Ox Activity Level  At rest  Oxygen Delivery Room Air/ 21 %  *Intake and Output.:   22-Oct-14 05:00  Stool  pt reports a loose stool with a small amount of red blood   Brief Assessment:  Cardiac Regular   Respiratory clear BS   Gastrointestinal details normal Bowel sounds normal  tense/ascites, hepatosplenomegaly   Lab Results:  Hepatic:  16-Oct-14 19:33   Bilirubin, Total  17.4  Alkaline Phosphatase  232  SGPT (ALT) 24  SGOT (AST)  86  17-Oct-14 04:32   Bilirubin, Direct  13.60 (Result(s) reported on 29 Sep 2013 at 03:59PM.)  Bilirubin, Total  17.6  Alkaline Phosphatase  227  SGPT (ALT) 24  SGOT (AST)  89  18-Oct-14 02:58   Bilirubin, Direct  13.50 (Result(s) reported on 30 Sep 2013 at 03:58AM.)  Bilirubin, Total  17.0  Alkaline Phosphatase  208  SGPT (ALT) 22  SGOT (AST)  83  19-Oct-14 04:24   Bilirubin, Direct  14.00 (Result(s) reported on 01 Oct 2013 at 05:50AM.)  Bilirubin, Total  17.6  Alkaline Phosphatase  217  SGPT (ALT) 19  SGOT (AST)  80  20-Oct-14 04:21   Bilirubin, Direct  16.70 (Result(s) reported on 02 Oct 2013 at 06:00AM.)  Bilirubin, Total  20.3  Alkaline Phosphatase  233  SGPT (ALT) 23  SGOT (AST)  91  21-Oct-14 07:07   Bilirubin, Direct  13.60 (Result(s) reported on 03 Oct 2013 at 08:16AM.)  Bilirubin, Total  16.6  Alkaline Phosphatase  207  SGPT (ALT) 21  SGOT (AST)  79  22-Oct-14 07:20   Bilirubin, Direct  11.30 (Result(s) reported  on 04 Oct 2013 at 09:42AM.)  Bilirubin, Total  13.8  Alkaline Phosphatase  212  SGPT (ALT) 35  SGOT (AST)  123  Total Protein, Serum  5.2  Albumin, Serum  1.4  Routine Chem:  22-Oct-14 07:20   Glucose, Serum 90  BUN 13  Creatinine (comp) 0.82  Sodium, Serum  134  Potassium, Serum  3.4  Chloride, Serum 100  CO2, Serum 28  Calcium (Total), Serum  8.4  Osmolality (calc) 268  eGFR (African American) >60  eGFR (Non-African American) >60 (eGFR values <19m/min/1.73 m2 may be an indication of chronic kidney disease (CKD). Calculated eGFR is useful in patients with stable renal function. The eGFR calculation will not be reliable in acutely ill patients when serum creatinine is changing rapidly. It is not useful in  patients on dialysis. The eGFR calculation may not be applicable to patients at the low and high extremes of body sizes, pregnant women, and vegetarians.)  Anion Gap  6  Routine Coag:  17-Oct-14 16:47   INR 1.5 (INR reference interval applies to patients on anticoagulant therapy. A single INR therapeutic range for coumarins is not optimal for all indications; however, the suggested range for most indications is 2.0 - 3.0. Exceptions to the INR Reference Range may include: Prosthetic heart valves, acute myocardial infarction, prevention of myocardial infarction, and combinations of  aspirin and anticoagulant. The need for a higher or lower target INR must be assessed individually. Reference: The Pharmacology and Management of the Vitamin K  antagonists: the seventh ACCP Conference on Antithrombotic and Thrombolytic Therapy. [id number] Sept:126 (3suppl): [id number]. A HCT value >55% may artifactually increase the PT.  In one study,  the increase was an average of 25%. Reference:  "Effect on Routine and Special Coagulation Testing Values of Citrate Anticoagulant Adjustment in Patients with High HCT Values." American Journal of Clinical Pathology 2006;126:400-405.)   18-Oct-14 02:58   INR 1.5 (INR reference interval applies to patients on anticoagulant therapy. A single INR therapeutic range for coumarins is not optimal for all indications; however, the suggested range for most indications is 2.0 - 3.0. Exceptions to the INR Reference Range may include: Prosthetic heart valves, acute myocardial infarction, prevention of myocardial infarction, and combinations of aspirin and anticoagulant. The need for a higher or lower target INR must be assessed individually. Reference: The Pharmacology and Management of the Vitamin K  antagonists: the seventh ACCP Conference on Antithrombotic and Thrombolytic Therapy. [id number] Sept:126 (3suppl): [id number]. A HCT value >55% may artifactually increase the PT.  In one study,  the increase was an average of 25%. Reference:  "Effect on Routine and Special Coagulation Testing Values of Citrate Anticoagulant Adjustment in Patients with High HCT Values." American Journal of Clinical Pathology 2006;126:400-405.)  19-Oct-14 04:24   INR 1.6 (INR reference interval applies to patients on anticoagulant therapy. A single INR therapeutic range for coumarins is not optimal for all indications; however, the suggested range for most indications is 2.0 - 3.0. Exceptions to the INR Reference Range may include: Prosthetic heart valves, acute myocardial infarction, prevention of myocardial infarction, and combinations of aspirin and anticoagulant. The need for a higher or lower target INR must be assessed individually. Reference: The Pharmacology and Management of the Vitamin K  antagonists: the seventh ACCP Conference on Antithrombotic and Thrombolytic Therapy. [id number] Sept:126 (3suppl): [id number]. A HCT value >55% may artifactually increase the PT.  In one study,  the increase was an average of 25%. Reference:  "Effect on Routine and Special Coagulation Testing Values of Citrate Anticoagulant Adjustment in Patients with High  HCT Values." American Journal of Clinical Pathology 2006;126:400-405.)  20-Oct-14 04:21   INR 1.5 (INR reference interval applies to patients on anticoagulant therapy. A single INR therapeutic range for coumarins is not optimal for all indications; however, the suggested range for most indications is 2.0 - 3.0. Exceptions to the INR Reference Range may include: Prosthetic heart valves, acute myocardial infarction, prevention of myocardial infarction, and combinations of aspirin and anticoagulant. The need for a higher or lower target INR must be assessed individually. Reference: The Pharmacology and Management of the Vitamin K  antagonists: the seventh ACCP Conference on Antithrombotic and Thrombolytic Therapy. [id number] Sept:126 (3suppl): [id number]. A HCT value >55% may artifactually increase the PT.  In one study,  the increase was an average of 25%. Reference:  "Effect on Routine and Special Coagulation Testing Values of Citrate Anticoagulant Adjustment in Patients with High HCT Values." American Journal of Clinical Pathology 2006;126:400-405.)  21-Oct-14 07:07   INR 1.6 (INR reference interval applies to patients on anticoagulant therapy. A single INR therapeutic range for coumarins is not optimal for all indications; however, the suggested range for most indications is 2.0 - 3.0. Exceptions to the INR Reference Range may include: Prosthetic heart valves, acute myocardial infarction, prevention of myocardial infarction, and combinations of aspirin and anticoagulant. The need for  a higher or lower target INR must be assessed individually. Reference: The Pharmacology and Management of the Vitamin K  antagonists: the seventh ACCP Conference on Antithrombotic and Thrombolytic Therapy. [id number] Sept:126 (3suppl): [id number]. A HCT value >55% may artifactually increase the PT.  In one study,  the increase was an average of 25%. Reference:  "Effect on Routine and Special Coagulation  Testing Values of Citrate Anticoagulant Adjustment in Patients with High HCT Values." American Journal of Clinical Pathology 2006;126:400-405.)  22-Oct-14 07:20   Prothrombin  18.2  INR 1.5 (INR reference interval applies to patients on anticoagulant therapy. A single INR therapeutic range for coumarins is not optimal for all indications; however, the suggested range for most indications is 2.0 - 3.0. Exceptions to the INR Reference Range may include: Prosthetic heart valves, acute myocardial infarction, prevention of myocardial infarction, and combinations of aspirin and anticoagulant. The need for a higher or lower target INR must be assessed individually. Reference: The Pharmacology and Management of the Vitamin K  antagonists: the seventh ACCP Conference on Antithrombotic and Thrombolytic Therapy. [id number] Sept:126 (3suppl): [id number]. A HCT value >55% may artifactually increase the PT.  In one study,  the increase was an average of 25%. Reference:  "Effect on Routine and Special Coagulation Testing Values of Citrate Anticoagulant Adjustment in Patients with High HCT Values." American Journal of Clinical Pathology 2006;126:400-405.)   Assessment/Plan:  Assessment/Plan:  Assessment 1) abnormal lfts-ETOH hepatopathy/drug induced liver injury-labs improving.   Plan 1) discussion with patinet that continued etoh use af any amount could be lethal.  counselled abstinence/  2) continue prednisone 40 mg po for one month then taper over 7-10 days 3) cipro 500 mg po once a week for sbp prophylaxis 4) continue aldactone, will add 20 mg lasix daily 5) continue lactulose 30 ml bid-titrate to 2-3 bm daily 6) GI fu in 10 days, will need to have labs drawn that day 7) fu PMD 8) continue ppi daily 9) I will ask Dr Rayann Heman to see tomorrow, patient will likely be able to go home with close followup.   Electronic Signatures: Loistine Simas (MD)  (Signed 22-Oct-14 15:10)  Authored: Chief  Complaint, VITAL SIGNS/ANCILLARY NOTES, Brief Assessment, Lab Results, Assessment/Plan   Last Updated: 22-Oct-14 15:10 by Loistine Simas (MD)

## 2015-04-05 NOTE — Consult Note (Signed)
PATIENT NAME:  Seth [name], Cola D MR#:  [id number] DATE OF BIRTH:  May 26, 1971  DATE OF CONSULTATION:  10/09/2013  REFERRING PHYSICIAN:  Starleen Armsawood S. Elgergawy, MD CONSULTING PHYSICIAN:  Keturah Barrehristiane H. Hennesy Sobalvarro, NP  REASON FOR CONSULTATION: Evaluation of elevated LFTs.   HISTORY OF PRESENT ILLNESS: Appreciate consult for 44 year old Caucasian man for evaluation of LFTs, who was recently hospitalized with this and it was determined to be a consequence of Depakote use and alcohol use. He was discharged on 10/23, then presented to the Emergency Department on Saturday with increasing lower extremity edema and abdominal swelling that started last Friday. Both he and his wife state adherence to his discharge medications of Aldactone, Lasix, chlorthalidone, Cipro and Prilosec, also adherence to daily prednisone, gabapentin. States he has not needed the Valium. Was placed on prednisone at the time of last admission, [facility] his DILI/questionable alcoholic hepatitis. Is currently on 40 mg p.o. daily with plans to taper in about 3 weeks. Reports also some mild right upper quadrant discomfort. Denies further GI complaints. Was noted to have some mild elevation on his ammonia. Has received some  lactulose while in hospital, but states he has not been on lactulose or Xifaxan at home. Reports 2 to 4 bowel movements a day without signs of blood. States no difficulty with mentation. Does report some easy bruising, and that he is presently following a low-sodium diet and maintaining sobriety. Do note significantly improved bilirubin presently, LFTs starting to improve from 10/21. PT-INR has improved. Note plans today for paracentesis for fluid removal due to findings of ascites on ultrasound. Had diagnostic paracentesis in the ED that was negative for SBP by report.   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   SOCIAL HISTORY: Denies smoking. States he has been abstaining from alcohol for 3 to 4 months. Has used cocaine in the past, states none recent. Married, lives with wife.   PAST SURGICAL HISTORY: Negative.   FAMILY HISTORY: Significant for brother with hepatitis of unknown type and cirrhosis and for hypertension.   ALLERGIES: NKDA.  HOME MEDICATIONS: Prednisone 40 mg p.o. daily, Aldactone 25 mg p.o. daily, gabapentin 300 mg p.o. t.i.d., Valium 5 mg t.i.d. p.r.n., chlorthalidone 25 mg p.o. daily, Lasix 20 mg p.o. daily, Prilosec 20 mg p.o. b.i.d., ciprofloxacin 250 mg p.o. every week.   REVIEW OF SYSTEMS: Ten systems reviewed, unremarkable other than what is noted above.   LABORATORY DATA: Most recent glucose 88, BUN 16, creatinine 0.94, sodium 131, potassium 3.3, chloride 32, GFR greater than 60, ammonia 38, calcium 9.0, total serum protein 5.7, albumin 1.6, bilirubin total 9.0, direct 7.6, alkaline phosphatase 226, AST 121, ALT 85. WBC 29, hemoglobin 10.3, hematocrit 29.5, platelet count 371; red cells are macrocytic.   PHYSICAL EXAMINATION: VITAL SIGNS: Most recent temperature 97.7, pulse 91, respiratory rate 20, blood pressure 129/76, SaO2 of 99% on room air.  GENERAL: Caucasian man lying in bed in no acute distress, appears comfortable.  HEENT: Normocephalic, atraumatic. Sclerae icteric. Mucous membranes pink, moist.  NECK: Supple. No thyromegaly, JVD or lymphadenopathy.  CHEST: Respirations eupneic. Lungs CTAB.  CARDIAC: S1, S2, RRR. No MRG. Peripheral pulses palpable. No significant lower extremity edema.  ABDOMEN: Somewhat distended. Bowel sounds x 4. Soft. No noted tenderness, guarding, rigidity, peritoneal signs or hepatosplenomegaly. Do note some caput medusa, slight ecchymosis on the right side laterally on the abdomen.  EXTREMITIES: Moves all extremities well  x 4. Strength 5 out of 5. Sensation appears to be intact.  SKIN: Warm, dry, jaundiced. Scattered few  ecchymosis.  NEUROLOGIC: Alert, oriented x 3. Speech clear. Cranial nerves intact.  PSYCHIATRIC: Calm, cooperative, pleasant.   IMPRESSION AND PLAN: Drug-induced liver injury/alcoholic liver disease. Do recommend increasing Aldactone to 50 mg p.o. daily and continuing Lasix, chlorthalidone, Cipro, Prilosec and prednisone at present. Recommend electrolyte correction and daily LFTs/PT-INR test. Continue 2 gram sodium diet and sobriety. May benefit from Xifaxan 550 mg p.o. b.i.d.   Thank you very much for this consult.   These services have been provided by Vevelyn Pat, MSN, [facility], in collaboration with Christena Deem, MD, with whom I have discussed this patient in full.    ____________________________ Keturah Barre, NP chl:jm D: 10/09/2013 14:31:29 ET T: 10/09/2013 16:06:38 ET JOB#: [id number]  cc: Keturah Barre, NP, <Dictator> Eustaquio Maize Baileigh Modisette FNP ELECTRONICALLY SIGNED 11/20/2013 13:03

## 2015-04-05 NOTE — Consult Note (Signed)
Referring Physician:  Demetrios Loll :   Primary Care Physician:  Selinda Michaels, [address], [place], [place] [address], [place]  Reason for Consult: Admit Date: 28-Sep-2013  Chief Complaint: Hx of seizues, abnormal liver function  Reason for Consult: seizure   History of Present Illness: History of Present Illness:   44 year old man with a history of alcohol abuse and seizure disorder presents due to hyperbilirubinemia.  He has a history of seizures.  Previously on Keppra but feeling very aggressive so it had to be stopped.  Last seizure about a month ago.  Patient was started on depakote recently to control seizures and aggression.  No seizures since one a month ago.  Seizures moderate severity and interfere with his quality of life.  Cannot recall other AEDs in the past.  Patient's male companion verifies that he has not used alcohol recently. MEDICAL HISTORY: disorder, abuse.  SURGICAL HISTORY:  HISTORY: smoking, alcohol drinking three months ago,cocaine before, but denies any drug abuse this time. patient denies any sexually transmitted disease and uses protection. any HIV positive test before.  HISTORY:  MEDICATIONS: 20 mg p.o. daily, 5 mg p.o. 3 times a day p.r.n. for anxiety, sodium 125 mg p.o. tablets 3 times a day, 125 mg p.o. daily     ROS:  General denies complaints   HEENT no complaints   Lungs no complaints   Cardiac no complaints   GI no complaints   GU no complaints   Musculoskeletal no complaints   Extremities no complaints   Skin no complaints   Endocrine no complaints   Psych no complaints   Past Medical/Surgical Hx:  Cocaine abuse:   Seizures:   Hypertension:   Denies medical history:   denies surg hx:   Home Medications: Medication Instructions Last Modified Date/Time  Prilosec 20 mg oral delayed release capsule 1 cap(s) orally 2 times a day 16-Oct-14 17:46  chlorthalidone 25 mg oral tablet 1 tab(s) orally  once a day 16-Oct-14 17:46  Valium 5 mg oral tablet 1 tab(s) orally 3 times a day, As Needed- for Anxiety, Nervousness  16-Oct-14 17:46  divalproex sodium 125 mg oral delayed release tablet 1 tab(s) orally 3 times a day 16-Oct-14 17:46   [facility] Current Meds:   Sodium Chloride 0.9% w/KCl 91mq/L, 1000 ml at 50 ml/hr  Influenza Virus Quadrivalent Vaccine injection, 0.5 ml, Intramuscular, once  Indication: provide Active Immunity to Influenza Strains contained in Vaccine, ***The patient must have a temperature of 100.5 or less, anything greater the patient needs to be afebrile x 24 hours before administration***, **Latex Free**  Nursing Saline Flush, 3 to 6 ml, IV push, Q1M PRN for IV Maintenance  Acetaminophen * tablet, ( Tylenol (325 mg) tablet)  650 mg Oral q4h PRN for pain or temp. greater than 100.4  - Indication: Pain/Fever  HePARin injection, 5000 unit(s), Subcutaneous, q8h  Indication: Anticoagulant, Monitor Anticoags per hospital protocol  MorphINE  injection, 2 to 4 mg, IV push, q4h PRN for pain  Indication: Pain, [Med Admin Window: 30 mins before or after scheduled dose]  Ondansetron injection, ( Zofran injection )  4 mg, IV push, q4h PRN for Nausea/Vomiting  Indication: Nausea/ Vomiting  Pantoprazole tablet, 40 mg Oral q6am  - Indication: Erosive Esophagitis/ GERD  Instructions:  DO NOT CRUSH  Allergies:  No Known Allergies:   Vital Signs: **Vital Signs.:   17-Oct-14 08:07  Vital Signs Type Routine  Temperature Temperature (F) 97.6  Celsius 36.4  Temperature Source oral  Pulse Pulse 100  Respirations Respirations 20  Systolic BP Systolic BP 449  Diastolic BP (mmHg) Diastolic BP (mmHg) 74  Mean BP 91  Pulse Ox % Pulse Ox % 93  Pulse Ox Activity Level  At rest  Oxygen Delivery Room Air/ 21 %   EXAM: GENERAL: Pleasant.  NAD.  Normocephalic and atraumatic.  EYES: Funduscopic exam shows normal disc size, appearance and C/D ratio without clear evidence of  papilledema.  CARDIOVASCULAR: S1 and S2 sounds are within normal limits, without murmurs, gallops, or rubs.  MUSCULOSKELETAL: Bulk - Normal Tone - Normal Pronator Drift - Absent bilaterally. Ambulation - Gait and station are within normal limits. Romberg - Absent  R/L 5/5    Shoulder abduction (deltoid/supraspinatus, axillary/suprascapular n, C5) 5/5    Elbow flexion (biceps brachii, musculoskeletal n, C5-6) 5/5    Elbow extension (triceps, radial n, C7) 5/5    Finger adduction (interossei, ulnar n, T1)   5/5    Hip flexion (iliopsoas, L1/L2) 5/5    Knee flexion (hamstrings, sciatic n, L5/S1) 5/5    Knee extension (quadriceps, femoral n, L3/4) 5/5    Ankle dorsiflexion (tibialis anterior, deep fibular n, L4/5) 5/5    Ankle plantarflexion (gastroc, tibial n, S1)  NEUROLOGICAL: MENTAL STATUS: Patient is oriented to person, place and time.  Recent and remote memory are intact.  Attention span and concentration are intact.  Naming, repetition, comprehension and expressive speech are within normal limits.  Patient's fund of knowledge is within normal limits for educational level.  CRANIAL NERVES: Normal    CN II (normal visual acuity and visual fields) Normal    CN III, IV, VI (extraocular muscles are intact) Normal    CN V (facial sensation is intact bilaterally) Normal    CN VII (facial strength is intact bilaterally) Normal    CN VIII (hearing is intact bilaterally) Normal    CN IX/X (palate elevates midline, normal phonation) Normal    CN XI (shoulder shrug strength is normal and symmetric) Normal    CN XII (tongue protrudes midline)   SENSATION: Intact to pain and temp bilaterally (spinothalamic tracts) Intact to position and vibration bilaterally (dorsal columns)   REFLEXES: R/L 2+/2+    Biceps 2+/2+    Brachioradialis   2+/2+    Patellar 2+/2+    Achilles   COORDINATION/CEREBELLAR: Finger to nose testing is within normal limits..  Lab Results:  Thyroid:   17-Oct-14 04:32   Thyroid Stimulating Hormone 3.92 (0.45-4.50 (International Unit)  ----------------------- Pregnant patients have  different reference  ranges for TSH:  - - - - - - - - - -  Pregnant, first trimetser:  0.36 - 2.50 uIU/mL)  Hepatic:  17-Oct-14 04:32   Bilirubin, Total  17.6  Alkaline Phosphatase  227  SGPT (ALT) 24  SGOT (AST)  89  Total Protein, Serum  6.0  Albumin, Serum  1.6  Routine Chem:  16-Oct-14 19:33   Lipase 211 (Result(s) reported on 28 Sep 2013 at 08:12PM.)  Ammonia, Plasma  50 (Result(s) reported on 28 Sep 2013 at 08:33PM.)  17-Oct-14 04:32   Glucose, Serum 91  BUN 7  Creatinine (comp) 0.69  Sodium, Serum  131  Potassium, Serum  3.1  Chloride, Serum  95  CO2, Serum 29  Calcium (Total), Serum  8.1  Osmolality (calc) 260  eGFR (African American) >60  eGFR (Non-African American) >60 (eGFR values <53m/min/1.73 m2 may be an indication of chronic kidney disease (CKD).  Calculated eGFR is useful in patients with stable renal function. The eGFR calculation will not be reliable in acutely ill patients when serum creatinine is changing rapidly. It is not useful in  patients on dialysis. The eGFR calculation may not be applicable to patients at the low and high extremes of body sizes, pregnant women, and vegetarians.)  Anion Gap 7  Magnesium, Serum 1.8 (1.8-2.4 THERAPEUTIC RANGE: 4-7 mg/dL TOXIC: > 10 mg/dL  -----------------------)  Hemoglobin A1c ([facility]) 4.4 (The American Diabetes Association recommends that a primary goal of therapy should be <7% and that physicians should reevaluate the treatment regimen in patients with HbA1c values consistently >8%.)  Cholesterol, Serum 164  Triglycerides, Serum  232  HDL (INHOUSE)  6  VLDL Cholesterol Calculated  46  LDL Cholesterol Calculated  112 (Result(s) reported on 29 Sep 2013 at 05:10AM.)  Urine Drugs:  49-QPR-91 63:84   Tricyclic Antidepressant, Ur Qual (comp) NEGATIVE (Result(s) reported on 29 Sep 2013 at 12:32AM.)  Amphetamines, Urine Qual. NEGATIVE  MDMA, Urine Qual. NEGATIVE  Cocaine Metabolite, Urine Qual. NEGATIVE  Opiate, Urine qual POSITIVE  Phencyclidine, Urine Qual. NEGATIVE  Cannabinoid, Urine Qual. NEGATIVE  Barbiturates, Urine Qual. NEGATIVE  Benzodiazepine, Urine Qual. POSITIVE (----------------- The URINE DRUG SCREEN provides only a preliminary, unconfirmed analytical test result and should not be used for non-medical  purposes.  Clinical consideration and professional judgment should be  applied to any positive drug screen result due to possible interfering substances.  A more specific alternate chemical method must be used in order to obtain a confirmed analytical result.  Gas chromatography/mass spectrometry (GC/MS) is the preferred confirmatory method.)  Methadone, Urine Qual. NEGATIVE  Routine Coag:  17-Oct-14 16:47   Prothrombin  18.0  INR 1.5 (INR reference interval applies to patients on anticoagulant therapy. A single INR therapeutic range for coumarins is not optimal for all indications; however, the suggested range for most indications is 2.0 - 3.0. Exceptions to the INR Reference Range may include: Prosthetic heart valves, acute myocardial infarction, prevention of myocardial infarction, and combinations of aspirin and anticoagulant. The need for a higher or lower target INR must be assessed individually. Reference: The Pharmacology and Management of the Vitamin K  antagonists: the seventh ACCP Conference on Antithrombotic and Thrombolytic Therapy. YKZLD.3570 Sept:126 (3suppl): [id number]. A HCT value >55% may artifactually increase the PT.  In one study,  the increase was an average of 25%. Reference:  "Effect on Routine and Special Coagulation Testing Values of Citrate Anticoagulant Adjustment in Patients with High HCT Values." American Journal of Clinical Pathology 2006;126:400-405.)  Routine Hem:  16-Oct-14 19:33   Bands 8  Diff Comment  4 TARGET CELLS  Diff Comment 5 PLTS VARIED IN SIZE  Result(s) reported on 28 Sep 2013 at 08:13PM.  17-Oct-14 04:32   WBC (CBC)  19.3  RBC (CBC)  3.05  Hemoglobin (CBC)  10.9  Hematocrit (CBC)  31.3  Platelet Count (CBC) 267 (Result(s) reported on 29 Sep 2013 at 07:55AM.)  MCV  102  MCH  35.5  MCHC 34.7  RDW  15.7  Segmented Neutrophils 87  Lymphocytes 7  Monocytes 6  Diff Comment 1 POIKILOCYTOSIS  Diff Comment 2 HYPOCHROMIA  Diff Comment 3 PLTS VARIED IN SIZE  Result(s) reported on 29 Sep 2013 at 07:55AM.   Impression/Recommendations: Recommendations:   44 year old man with a history of alcohol abuse and seizure disorder presents due to hyperbilirubinemia.  Neurology is asked to evaluate given history of seizures and depakote has been  stopped out of concern for his hyperbilirubinemia.  patient unable to tolerate keppra or depakote, will start patient on vimpat.   dose of vimpat with 200 mg PO x 1. -Then start vimpat 100 mg bid maintenance dosing x 1 week, then increase to 150 mg bid for a week, then increase to long term maintenance dose of 200 mg bid and continue. -Vimpat does not significantly affect the liver so is a good alternative as most AEDs involve hepatic metabolism or hepatic enzyme induction or suppression which vimpat does not do to any significant degree.   have reviewed the results of the most recent labs as outlined above and answered all related questions.      Electronic Signatures: Anabel Bene (MD)  (Signed 17-Oct-14 22:09)  Authored: REFERRING PHYSICIAN, Primary Care Physician, Consult, History of Present Illness, Review of Systems, PAST MEDICAL/SURGICAL HISTORY, HOME MEDICATIONS, Current Medications, ALLERGIES, NURSING VITAL SIGNS, Physical Exam-, LAB RESULTS, Recommendations   Last Updated: 17-Oct-14 22:09 by Anabel Bene (MD)

## 2015-04-05 NOTE — Consult Note (Signed)
Brief Consult Note: Diagnosis: Jaundice, abnormal lfts.   Patient was seen by consultant.   Consult note dictated.   Comments: Appreciate consult for 44 y/o caucasian man admitted with elevated LFTs/jaundice for evaluation of the same. Gives a hx of heavy etoh consumption since he was 44y/o, up to 12pk/d. Used cocaine/marijuana in past but states none recently. Says he quit etoh about 2737m ago. No history of prior jaundice/ascites. States no hx >lfts in past. Attributes his illness at present to depakote use for seizures. This has been stopped. Had neuro cx today.  Reports over the last couple of weeks onset of abd cramps, distension, jaundice. Urine turned dark. No other GI complaints. Takes Prilosec 20mg  po daily. QD lft and pt/inr Liver hx significant also for incarceration, tattoos single piercing. No military/health care svs, foreign travel, dialysis/blood transfusion/viral hepatitis/family hx liver dz. US with HSM, fatty liver. Impression/plan: Elevated LFTs, jaundice, ascites. May be multifactorial. Should avoid substances that harm liver. Do strongly recommend sobriety. Liver serologies. 2gm Na diet. Will likely need diuretics..  Electronic Signatures: Vevelyn PatLondon, Desirey Keahey H (NP)  (Signed 17-Oct-14 17:31)  Authored: Brief Consult Note   Last Updated: 17-Oct-14 17:31 by Keturah BarreLondon, Tarvaris Puglia H (NP)

## 2015-04-06 NOTE — H&P (Signed)
PATIENT NAME:  Seth [name], Seth [name] DATE OF BIRTH:  November 15, 1971  DATE OF ADMISSION:  04/04/2014  PRIMARY CARE PROVIDER: Marland McalpineSheikh A. Ellsworth Lennoxejan-Sie, MD  EMERGENCY DEPARTMENT REFERRING PHYSICIAN: Darien Ramusavid W. Kaminski, MD  CHIEF COMPLAINT: Seizure.   ADMITTING DIAGNOSES: Seizure, hyponatremia, hypokalemia.  HISTORY OF PRESENT ILLNESS: The patient is a 44 year old white male with history of alcohol abuse, previous history of liver failure in the past, who has hypertension and seizure disorder, who states that about 3 weeks ago he had a seizure episode, was seen by Dr. Malvin JohnsPotter and was started on Vimpat. He states that he has been taking that and then earlier today, he went outside and then when he came back, he started feeling like he was going to have a seizure and then he had an episode where he states that his body started becoming stiff. He was awake during this period, and then he passed out and that is when he called EMS. When EMS brought him to the ED, he was awake, alert. In the ER, he had laboratory evaluation done, and it showed that his sodium was 118 and his potassium was 2.9. Due to these, we were asked to admit the patient. He otherwise denies any fevers, chills. No chest pain. He does report that he did stop drinking after he was hospitalized in October after acute liver failure as a result of alcohol and valproic acid effect, but then he started back drinking and he has been drinking daily. His last drink was last night. He states that he has not stopped drinking.   PAST MEDICAL HISTORY: Significant for:  1.  History of hypertension. 2.  History of seizure disorder for 6 years. 3.  History of alcohol abuse. 4.  History of liver failure thought to be due to alcohol abuse and valproic acid.   5.  GERD.  6.  Previous history of hyponatremia.  7.  According to patient, history of MI.  ALLERGIES: None.   CURRENT MEDICATIONS: He is currently on amlodipine 10 daily, chlorthalidone 25 mg 1  tab p.o. daily, cyclobenzaprine 10 mg 1 tab p.o. t.i.d. p.r.n. for muscle spasm, diazepam 5 mg 1 tab p.o. t.i.d. p.r.n. anxiety, gabapentin 400 mg 1 tab p.o. t.i.d., ketoconazole 2% apply topically to affected area b.i.d., omeprazole 10 daily, oxycodone 5 mg q.6 p.r.n.   SOCIAL HISTORY: The patient does not smoke. He is starting to drink again for the past 5 months; he drinks two 40-ounce beers a day since the age of 44; he did briefly quit drinking. History of cocaine abuse in the past.   PAST SURGICAL HISTORY: None.   FAMILY HISTORY: Significant for hypertension.   REVIEW OF SYSTEMS:   CONSTITUTIONAL: Denies any fevers, chills. Complains of fatigue, weakness. No weight loss. No weight gain.  EYES: No blurred or double vision. No glaucoma. No cataracts.  EARS, NOSE, THROAT:  No tinnitus, ear pain. No hearing loss.  RESPIRATORY: Denies any cough, wheezing, hemoptysis. No COPD.  CARDIOVASCULAR: Denies any chest pain, arrhythmia, palpitations or syncope.  GASTROINTESTINAL: Denies any nausea, vomiting, diarrhea.  GENITOURINARY: Denies any dysuria, hematuria, renal calculus.  ENDOCRINE: Denies any polyuria, polydipsia, heat or cold intolerance.  HEMATOLOGIC: Denies anemia, easy bruisability.  SKIN: No rash.  MUSCULOSKELETAL: Denies gout, arthritis.  NEUROLOGIC: No CVA, TIA. Has a history of seizures.  PSYCHIATRIC: Does have anxiety.   PHYSICAL EXAMINATION: VITAL SIGNS: Temperature 98.9, pulse 98, respirations 19, blood pressure 130/70.  GENERAL: The patient is a well-developed male in  no acute distress.  HEENT: Head atraumatic, normocephalic. Pupils equally round, reactive to light and accommodation. There is no conjunctival pallor, no scleral icterus. Nasal exam shows no drainage or ulceration. Oropharynx is clear without any exudate.  NECK: Supple without any JVD.  CARDIOVASCULAR: Regular rate and rhythm. No murmurs, rubs, clicks or gallops.  LUNGS: Clear to auscultation bilaterally  without any rales, rhonchi, wheezing.  ABDOMEN: Soft, nontender, nondistended. Positive bowel sounds x 4.  EXTREMITIES: No clubbing, cyanosis or edema.  SKIN: No rash.  LYMPHATICS: No lymph nodes palpable.  VASCULAR: Good DP, PT pulses.  PSYCHIATRIC: Not anxious or depressed.  NEUROLOGIC: Awake, alert, oriented x 3. No focal deficits.   LABORATORY DATA: Glucose 120, BUN 13, creatinine 0.80, sodium 118, potassium 2.9, chloride 75; CO2 is 34. LFTs: AST is 162, total protein 9.1. CT scan of the head without contrast showed no acute intracranial abnormality. WBC 11.6, hemoglobin 9.4, platelet count 233.   ASSESSMENT AND PLAN: The patient is a 44 year old white male with history of alcohol abuse, seizure disorder, presents with episode of seizure and noted to have hyponatremia, severe hypokalemia.  1.  Hyponatremia: Likely due to volume depletion, however, also could be related to his alcohol abuse and beer potomania. At this time, we will give him IV fluids containing normal saline. We will also check serum osmolality and urine osmolality and follow his sodium levels.  2.  Hypokalemia: Likely due to diuretic treatment. We will replace his potassium.  3.  Seizure, recurrent: The patient states that he had a drink yesterday, so it is unlikely withdrawal seizures. He does have a history of seizure in the past, has been started on Vimpat. At this time, I will start him on Keppra. Will have neurology evaluation. Will get EEG of the brain.  4.  Alcohol abuse: We will do CIWA protocol. 5.  Hypertension: Continue Norvasc. We will hold chlorthalidone in light of significant hypokalemia.  6.  Gastroesophageal reflux disease: Continue omeprazole.  7.  Deep venous thrombosis prophylaxis with heparin.   TIME SPENT: 50 minutes.    ____________________________ Lacie Scotts Allena Katz, MD shp:jcm D: 04/04/2014 18:16:54 ET T: 04/04/2014 18:57:32 ET JOB#: [id number]  cc: Tinnie Kunin H. Allena Katz, MD, <Dictator> Charise Carwin MD ELECTRONICALLY SIGNED 04/05/2014 13:52

## 2015-04-06 NOTE — H&P (Signed)
PATIENT NAME:  Seth [name], Seth [name] MR#:  [id number] DATE OF BIRTH:  1971-02-07  DATE OF ADMISSION:  07/02/2014  REFERRING PHYSICIAN: Dr. Fanny Bien.   GASTROENTEROLOGY: Dr. Marva Panda   CHIEF COMPLAINT: Weakness.  HISTORY OF PRESENT ILLNESS: A 44 year old Caucasian gentleman with a history of alcohol abuse, cirrhosis, seizure disorder, presenting with weakness. Describes two week duration of generalized weakness and fatigue. No fevers or chills. Of note, stopped drinking alcohol about one month ago. Since that time, he has increased his water and tea intake. He had routine labs performed by his PCP for weakness, noted to have a low potassium, and sent to the Emergency Department for further work-up and evaluation. No further complaints at this time, other than some leg pain where he fell a few days ago.    REVIEW OF SYSTEMS:  CONSTITUTIONAL: Denies fever, chills. Positive for fatigue, weakness.  EYES: Denies blurry vision, double vision or eye pain.  EARS, NOSE, THROAT: Denies tinnitus, ear pain, hearing loss.  RESPIRATORY: Denies cough, wheezing, shortness of breath.  CARDIOVASCULAR: Denies chest pain, palpitations, edema.  GASTROINTESTINAL: Denies nausea, vomiting, diarrhea, abdominal pain.  GENITOURINARY: Denies dysuria, hematuria.  ENDOCRINE: Denies nocturia or thyroid problems. HEMATOLOGIC AND LYMPHATIC:  Denies easy bruising or bleeding. SKIN: Denies rashes, lesions. MUSCULOSKELETAL: Denies pain in neck, back, shoulder, knees, hips or arthritic symptoms.  NEUROLOGIC: Denies paralysis, paresthesias.  PSYCHIATRIC: Denies anxiety or depressive symptoms.  Otherwise, full review of systems performed by me is negative.  PAST MEDICAL HISTORY: Hypertension, seizure disorder, alcohol abuse, gastroesophageal reflux disease, cirrhosis.   SOCIAL HISTORY: Remote tobacco usage, remote alcohol usage. Apparently last drink about one month ago. Denies any drug usage.   FAMILY HISTORY: Positive for  hypertension.   ALLERGIES: No known drug allergies.   HOME MEDICATIONS: Diazepam 5 mg p.o. 3 times daily as needed for anxiety, gabapentin 400 mg 3 times daily, Vimpat 100 mg p.o. b.i.d., Norvasc 10 mg p.o. daily, ketoconazole 2% topical cream to affected area b.i.d., chlorthalidone 25 mg p.o. daily, potassium 10 mEq p.o. daily, Flexeril 10 mg p.o. 3 times a day as needed for muscle spasm, Prilosec 10 mg 2 capsules by mouth b.i.d.  PHYSICAL EXAMINATION: VITAL SIGNS: Temperature 98.4, heart rate 92, respirations 20, blood pressure 161/71, saturating 97% on room air. Weight 78 kg, BMI 27.  GENERAL: Well-nourished, well-developed, Caucasian gentleman, appearing in no acute distress.  HEAD: Normocephalic, atraumatic.  EYES: Pupils are equal, round, and reactive to light. Extraocular muscles are intact. No scleral icterus.  MOUTH: Moist mucous membranes. Dentition intact. No abscess noted.  EARS, NOSE, AND THROAT: Clear without exudates. No external lesions.  NECK: Supple. No thyromegaly. No nodules. No JVD.  PULMONARY: Clear to auscultation bilaterally, without wheezes, rubs or rhonchi. No use of accessory muscles. Good respiratory effort.  CHEST: Nontender to palpation.  CARDIOVASCULAR: S1, S2. Regular rate and rhythm. No murmurs, rubs, or gallops. No edema. Pedal pulses 2+ bilaterally. GASTROINTESTINAL: Soft, nontender, nondistended. No masses. Positive bowel sounds. No hepatosplenomegaly.  MUSCULOSKELETAL: No swelling, clubbing, or edema. Range of motion full in all extremities.  NEUROLOGIC: Cranial nerves II through XII intact. No gross focal neurological deficits. Sensation intact. Reflexes intact. SKIN: No ulcerations, lesions, rashes, or cyanosis. Skin warm, dry. Turgor intact.  PSYCHIATRIC: Affect blunted. He is awake, alert, oriented x3. Insight and judgment intact.   LABORATORY DATA: Sodium 118, potassium 1.8, chloride 75, bicarbonate 33, BUN 10, creatinine 1.02, glucose 150. LFTs:  Bilirubin 1.6, albumin 3.3, AST of 40; otherwise within normal limits.  TSH 1.7. WBC 8.2, hemoglobin 8.4, platelets of 345. Urinalysis negative for evidence of infection. Abdominal ultrasound performed, revealing no evidence of ascites.    ASSESSMENT AND PLAN: A 44 year old gentleman with history of alcohol abuse, cirrhosis, seizure disorder, presenting with weakness, found to have abnormal labs.  1.  Hypokalemia. Replace to goal of 4 to 5.  2.  Hypomagnesemia. Replace to goal of 2.  3.  Hyponatremia, likely related to the combination of cirrhosis, increased water intake. He has 1029 mEq sodium deficit, to correct to 140. However, 468 mEq sodium deficit to correct to 128. We will initiate normal saline at 100 mL an hour for the first 24 hours. Follow sodium levels. We will check a urine sodium and also will hold his diuretics.  4.  Hypertension. Hold diuretics. Continue Norvasc. 5.  Seizure disorder. Continue Vimpat and Neurontin.  6.  Deep venous thrombosis prophylaxis with heparin subcutaneous.  CODE STATUS: The patient is full code.  TIME SPENT: 45 minutes.   ____________________________ Cletis Athensavid K. Ethan Kasperski, MD dkh:cg D: 07/03/2014 00:12:52 ET T: 07/03/2014 00:33:57 ET JOB#: [id number]  cc: Cletis Athensavid K. Reginna Sermeno, MD, <Dictator> Firman Petrow Synetta ShadowK Zeshan Sena MD ELECTRONICALLY SIGNED 07/03/2014 20:42

## 2015-04-06 NOTE — Discharge Summary (Signed)
PATIENT NAME:  Seth [name], Seth [name] MR#:  [id number] DATE OF BIRTH:  Dec 29, 1970  DATE OF ADMISSION:  07/02/2014 DATE OF DISCHARGE:  07/06/2014  ADMITTING PHYSICIAN:  Dr. Angelica Ranavid Hower.  DISCHARGE PHYSICIAN:  Dr. Ellsworth Lennoxejan-Sie.  PROCEDURES: None.   IMAGING: Lumbar spine, normal.   DISCHARGE DIAGNOSES:  1.  Hypokalemia.  2.  Liver cirrhosis.  3.  Lumbar pain.   HISTORY:  Seth [name] was admitted to the Emergency Room presenting with generalized weakness. He was found to have severe hypokalemia due to inadvertently taking excessive diuretics, attributed to failure to understand instruction due to illiteracy.   HOSPITAL COURSE: Seth [name] was admitted to a medical bed with cardiac monitoring. His potassium was supplement intravenously and orally, and eventually normalized.  One of his diuretics were discontinued at the time of discharge, and the patient's family was educated on use of medications. Despite that, we decided to discharge him home with home health nursing. The patient's liver cirrhosis remained stable without any complications. The patient is fully ambulatory. He did complain of lower back pain; however, x-rays did not show any osteoporotic disease to explain that and he was discharged on simple analgesia.   CONDITION AT DISCHARGE: Satisfactory.   DISCHARGE MEDICATIONS:  1.  Vimpat 100 mg b.i.[name].  2.  Amlodipine 10 mg daily.  3.  Ketoconazole 2% apply b.i.[name].  4.  Cyclobenzaprine 10 mg t.i.[name].  5.  Omeprazole 10 mg 2 capsules b.i.[name].  6.  Gabapentin 400 mg t.i.[name].  7.  Diazepam 5 mg t.i.[name].  8.  Lactulose 15 mL b.i.[name].  9.  Spironolactone 25 mg daily. 10.  Xifaxan 200 mg daily.  11.  Quinine sulfate 15 mg every 6 hours p.r.n.   The patient was instructed to discontinue his chlorthalidone, Lasix, and potassium.   DIET: Low sodium.   ACTIVITY: As tolerated.   FOLLOW UP:  Follow-up in 1- 2 weeks at [facility].  Patient has lost his insurance and will no longer be seeing [facility] unless his insurance situation changes. Otherwise, meanwhile, he is now an [facility] patient.    ____________________________ Silas FloodSheikh A. Ellsworth Lennoxejan-Sie, MD sat:ts [name]: 07/18/2014 10:21:44 ET T: 07/18/2014 11:15:20 ET JOB#: [id number]  cc: Sheikh A. Ellsworth Lennoxejan-Sie, MD, <Dictator> Charlesetta GaribaldiSHEIKH A TEJAN-SIE MD ELECTRONICALLY SIGNED 07/29/2014 12:43

## 2015-04-06 NOTE — Consult Note (Signed)
PATIENT NAME:  Seth [name], Seth [name] MR#:  [id number] DATE OF BIRTH:  02-07-1971  DATE OF CONSULTATION:  04/05/2014  CONSULTING PHYSICIAN:  Pauletta Browns, MD  REASON FOR CONSULTATION: Seizure.    PRESENTING HISTORY: This is a 44 year old Caucasian male with past medical history of alcohol abuse, previous history of liver failure, who has history also of hypertension and new-onset seizure about 3 weeks ago, at which point the patient was evaluated by Dr. Malvin Johns and was started on Vimpat. The patient states he was gardening yesterday when he was outside and felt dizzy, had an episode of arm stiffening and then does not remember the event. There was loss of consciousness, suspected seizure activity. Unsure what type of seizure activity, but appears to be generalized as the patient had loss of consciousness. Upon admission to the Emergency Department, the patient was found to be hyponatremic and hypokalemic with sodium of 118 and potassium of 2.9. The patient was admitted for observation. Currently, the patient states he is feeling better, just slight diffuse pressure-like headache.   PAST MEDICAL HISTORY: Significant for history of hypertension, history of seizure disorder, history of alcohol abuse, history of liver failure, GERD, previous history of hyponatremia and questionable history of myocardial infarction.   CURRENT MEDICATIONS: Include amlodipine, chlorthalidone, cyclobenzaprine, gabapentin, omeprazole.   SOCIAL HISTORY: The patient does not smoke. He is an EtOH drinker. Despite liver failure, he has restarted his drinking. History of cocaine abuse in the past.   PAST SURGICAL HISTORY: None.   FAMILY HISTORY: Significant for hypertension.   REVIEW OF SYSTEMS:  CONSTITUTIONAL: Denies any fever, any chills, generalized weakness.  HEENT: No blurry or double vision. No tinnitus. No ear pain.  RESPIRATORY: Denies any cough, any wheezing, any hemoptysis.  CARDIOVASCULAR: Denies any chest pain.   GASTROINTESTINAL: Denies any nausea, any vomiting or diarrhea.  GENITOURINARY: Denies any dysuria or hematuria.  ENDOCRINE: Denies polyuria or polydipsia.  MUSCULOSKELETAL: Denies any gout or arthritis.   LABORATORY WORK: Includes a white blood cell count of 8.2, hemoglobin 9.3, hematocrit 28.5. BUN 11, creatinine is 0.75, sodium improved at 126 and potassium improved to 3.1. The patient is status post CT of the head that did not show any acute intracranial pathology.   PHYSICAL EXAMINATION: VITAL SIGNS: The patient's temperature is 97.7, pulse 94, respirations 20, blood pressure 127/70, pulse oximetry 95. NEUROLOGIC: The patient is alert, awake and oriented to time, place and location. Speech appears to be fluent. No signs of dysarthria or aphasia. Extraocular movements intact. Pupils symmetrical. Visual fields intact. Facial sensation intact. Facial motor is intact. Tongue is midline. Uvula elevates symmetrically. Shoulder shrug intact. Sensation intact. Motor strength intact bilaterally, 5 out of 5. Sensation intact. Reflexes diminished. Coordination: Finger-to-nose intact. Gait not assessed.   IMPRESSION: A 44 year old male with history of alcohol abuse, despite liver failure, continues to drink, history of seizure disorder for about 6 years, last seizure was about 3 weeks ago and another one apparently yesterday, who presents with stiffness and suspected generalized seizure. The patient was found to be hyponatremic and hypokalemic.   PLAN: At this point, continue the CIWA protocol for alcohol abuse, thiamine, folate, banana bag. I do not think he needs an EEG monitoring as we know he has history of seizures and he is on medication that supposedly he takes. Please continue is Vimpat. Correct electrolyte abnormalities. I think the seizure was provoked by specifically by hyponatremia. No further changes. I do not think the patient needs any more imaging from a neurological  standpoint. Do not think  the patient needs an EEG at this point. Discharge planning when possible.  Thank you. It was a pleasure seeing this patient.  ____________________________ Pauletta BrownsYuriy Ifeoluwa Bartz, MD yz:lb D: 04/05/2014 11:35:32 ET T: 04/05/2014 12:45:07 ET JOB#: [id number]  cc: Pauletta BrownsYuriy Walker Paddack, MD, <Dictator> Pauletta BrownsYURIY Caidence Kaseman MD ELECTRONICALLY SIGNED 04/11/2014 11:33

## 2015-04-08 LAB — SURGICAL PATHOLOGY

## 2015-05-25 ENCOUNTER — Encounter: Payer: Self-pay | Admitting: Emergency Medicine

## 2015-05-25 ENCOUNTER — Emergency Department
Admission: EM | Admit: 2015-05-25 | Discharge: 2015-05-26 | Disposition: A | Discharge status: Home / Self Care | Payer: Medicaid Other | Attending: Emergency Medicine | Admitting: Emergency Medicine

## 2015-05-25 ENCOUNTER — Emergency Department: Payer: Medicaid Other

## 2015-05-25 DIAGNOSIS — R079 Chest pain, unspecified: Secondary | ICD-10-CM | POA: Insufficient documentation

## 2015-05-25 DIAGNOSIS — Z79899 Other long term (current) drug therapy: Secondary | ICD-10-CM | POA: Diagnosis not present

## 2015-05-25 DIAGNOSIS — F1310 Sedative, hypnotic or anxiolytic abuse, uncomplicated: Secondary | ICD-10-CM | POA: Diagnosis not present

## 2015-05-25 DIAGNOSIS — I10 Essential (primary) hypertension: Secondary | ICD-10-CM | POA: Insufficient documentation

## 2015-05-25 DIAGNOSIS — Z720 Tobacco use: Secondary | ICD-10-CM | POA: Insufficient documentation

## 2015-05-25 DIAGNOSIS — F1210 Cannabis abuse, uncomplicated: Secondary | ICD-10-CM | POA: Diagnosis not present

## 2015-05-25 DIAGNOSIS — R109 Unspecified abdominal pain: Secondary | ICD-10-CM | POA: Diagnosis not present

## 2015-05-25 DIAGNOSIS — M549 Dorsalgia, unspecified: Secondary | ICD-10-CM | POA: Insufficient documentation

## 2015-05-25 DIAGNOSIS — F1910 Other psychoactive substance abuse, uncomplicated: Secondary | ICD-10-CM

## 2015-05-25 DIAGNOSIS — G8929 Other chronic pain: Secondary | ICD-10-CM | POA: Diagnosis not present

## 2015-05-25 DIAGNOSIS — F329 Major depressive disorder, single episode, unspecified: Secondary | ICD-10-CM | POA: Insufficient documentation

## 2015-05-25 DIAGNOSIS — F32A Depression, unspecified: Secondary | ICD-10-CM

## 2015-05-25 HISTORY — DX: Dorsalgia, unspecified: M54.9

## 2015-05-25 HISTORY — DX: Other chronic pain: G89.29

## 2015-05-25 HISTORY — DX: Unspecified cirrhosis of liver: K74.60

## 2015-05-25 LAB — URINE DRUG SCREEN, QUALITATIVE (ARMC ONLY)
AMPHETAMINES, UR SCREEN: NOT DETECTED
Barbiturates, Ur Screen: NOT DETECTED
Benzodiazepine, Ur Scrn: POSITIVE — AB
Cannabinoid 50 Ng, Ur ~~LOC~~: POSITIVE — AB
Cocaine Metabolite,Ur ~~LOC~~: NOT DETECTED
MDMA (ECSTASY) UR SCREEN: NOT DETECTED
METHADONE SCREEN, URINE: NOT DETECTED
Opiate, Ur Screen: NOT DETECTED
Phencyclidine (PCP) Ur S: NOT DETECTED
TRICYCLIC, UR SCREEN: NOT DETECTED

## 2015-05-25 LAB — CBC WITH DIFFERENTIAL/PLATELET
BASOS ABS: 0.1 10*3/uL (ref 0–0.1)
BASOS PCT: 1 %
EOS PCT: 1 %
Eosinophils Absolute: 0.1 10*3/uL (ref 0–0.7)
HEMATOCRIT: 36.2 % — AB (ref 40.0–52.0)
Hemoglobin: 11.2 g/dL — ABNORMAL LOW (ref 13.0–18.0)
Lymphocytes Relative: 32 %
Lymphs Abs: 1.5 10*3/uL (ref 1.0–3.6)
MCH: 23.9 pg — ABNORMAL LOW (ref 26.0–34.0)
MCHC: 31 g/dL — ABNORMAL LOW (ref 32.0–36.0)
MCV: 77.3 fL — ABNORMAL LOW (ref 80.0–100.0)
MONO ABS: 0.4 10*3/uL (ref 0.2–1.0)
Monocytes Relative: 9 %
NEUTROS ABS: 2.7 10*3/uL (ref 1.4–6.5)
Neutrophils Relative %: 57 %
PLATELETS: 238 10*3/uL (ref 150–440)
RBC: 4.68 MIL/uL (ref 4.40–5.90)
RDW: 20.9 % — ABNORMAL HIGH (ref 11.5–14.5)
WBC: 4.8 10*3/uL (ref 3.8–10.6)

## 2015-05-25 LAB — COMPREHENSIVE METABOLIC PANEL
ALK PHOS: 147 U/L — AB (ref 38–126)
ALT: 64 U/L — AB (ref 17–63)
ANION GAP: 13 (ref 5–15)
AST: 104 U/L — AB (ref 15–41)
Albumin: 3.9 g/dL (ref 3.5–5.0)
BILIRUBIN TOTAL: 0.5 mg/dL (ref 0.3–1.2)
BUN: 6 mg/dL (ref 6–20)
CHLORIDE: 103 mmol/L (ref 101–111)
CO2: 26 mmol/L (ref 22–32)
Calcium: 8.7 mg/dL — ABNORMAL LOW (ref 8.9–10.3)
Creatinine, Ser: 0.74 mg/dL (ref 0.61–1.24)
Glucose, Bld: 86 mg/dL (ref 65–99)
Potassium: 3.6 mmol/L (ref 3.5–5.1)
SODIUM: 142 mmol/L (ref 135–145)
Total Protein: 7.4 g/dL (ref 6.5–8.1)

## 2015-05-25 LAB — TROPONIN I

## 2015-05-25 LAB — URINALYSIS COMPLETE WITH MICROSCOPIC (ARMC ONLY)
BACTERIA UA: NONE SEEN
Bilirubin Urine: NEGATIVE
Glucose, UA: NEGATIVE mg/dL
Hgb urine dipstick: NEGATIVE
Ketones, ur: NEGATIVE mg/dL
LEUKOCYTES UA: NEGATIVE
Nitrite: NEGATIVE
PH: 5 (ref 5.0–8.0)
Protein, ur: 100 mg/dL — AB
RBC / HPF: NONE SEEN RBC/hpf (ref 0–5)
SPECIFIC GRAVITY, URINE: 1.002 — AB (ref 1.005–1.030)
Squamous Epithelial / LPF: NONE SEEN

## 2015-05-25 LAB — ACETAMINOPHEN LEVEL

## 2015-05-25 LAB — ETHANOL: Alcohol, Ethyl (B): 303 mg/dL (ref <5)

## 2015-05-25 LAB — SALICYLATE LEVEL: Salicylate Lvl: 7 mg/dL (ref 2.8–30.0)

## 2015-05-25 MED ORDER — VITAMIN B-1 100 MG PO TABS
100.0000 mg | ORAL_TABLET | Freq: Every day | ORAL | Status: DC
  Administered 2015-05-26: 100 mg via ORAL
  Filled 2015-05-25: qty 1

## 2015-05-25 MED ORDER — LORAZEPAM 2 MG PO TABS
0.0000 mg | ORAL_TABLET | Freq: Four times a day (QID) | ORAL | Status: DC | PRN
  Administered 2015-05-26 (×2): 2 mg via ORAL

## 2015-05-25 MED ORDER — LORAZEPAM 2 MG/ML IJ SOLN: 0.0000 mg | Freq: Four times a day (QID) | INTRAMUSCULAR | Status: DC | PRN

## 2015-05-25 NOTE — ED Notes (Signed)
Pt observed hitting the wall, light and security camera with his right fist  Pt then laid down in the bed

## 2015-05-25 NOTE — ED Notes (Signed)

## 2015-05-25 NOTE — ED Notes (Signed)
Pt transferred into ED BHU room 3   Patient assigned to appropriate care area. Patient oriented to unit/care area: Informed that, for their safety, care areas are designed for safety and monitored by security cameras at all times; Visiting hours and phone times explained to patient. Patient verbalizes understanding, and verbal contract for safety obtained.     

## 2015-05-25 NOTE — ED Notes (Signed)
I cleaned up all the blood and placed gauze over his 3rd and 5th knuckle and his pointer finger

## 2015-05-25 NOTE — ED Notes (Signed)
Pt to ED from home c/o overdose.  Per EMS pt took 4 valium today and drank 8 beers today.  Pt states "I've got a lot going on in my life right now and women problems".  Pt denies SI or HI.  Pt denies previous suicide attempts.  Pt drowsy upon arrival.  Lungs clear x4, responds to loud voice and sternal rub.

## 2015-05-25 NOTE — ED Notes (Addendum)
Escorted pt to radiology with O[employer] officer  Xray completed without incident pt returned to his room    Appropriate to stimulation  No verbalized needs or concerns at this time  NAD assessed  Continue to monitor

## 2015-05-25 NOTE — BH Assessment (Addendum)
Assessment Note  Seth [name] is a 44 year old male that as IVC'd.  Patient reports that he was not trying to harm himself when he took the Valium and drank the beer.  Patient reports that he took the medicine and drank the beer because of his chronic pain.  Patient reports that he does hear and see things that no one else can see on a daily basis.   Patient reports that he has not been compliant with taking the psychiatric medication.  Patient reports that he does not remember what he was diagnosed with.     Patient denies prior psychiatric hospitalization.  Patient denies SI and HI.  Patient denies with drawl symptoms.  Patient denies physical, sexual or emotional abuse,    Axis I: Major Depression, Recurrent severe Axis II: Deferred Axis III:  Past Medical History  Diagnosis Date  . Hypertension   . Seizures   . Cirrhosis   . Chronic back pain    Axis IV: economic problems, occupational problems, other psychosocial or environmental problems, problems related to social environment, problems with access to health care services and problems with primary support group Axis V: 31-40 impairment in reality testing  Past Medical History:  Past Medical History  Diagnosis Date  . Hypertension   . Seizures   . Cirrhosis   . Chronic back pain     History reviewed. No pertinent past surgical history.  Family History: History reviewed. No pertinent family history.  Social History:  reports that he has been smoking Cigarettes.  He has a 6.25 pack-year smoking history. He does not have any smokeless tobacco history on file. He reports that he drinks about 4.8 oz of alcohol per week. He reports that he uses illicit drugs (Marijuana).  Additional Social History:  Alcohol / Drug Use History of alcohol / drug use?: Yes Longest period of sobriety (when/how long): Patient reprots that he is unable to remember  Negative Consequences of Use: Financial, Personal relationships, Work /  School Withdrawal Symptoms:  (Patient denies withdrawl symptoms ) Substance #1 Name of Substance 1: Cocaine 1 - Age of First Use: 19 1 - Amount (size/oz): varies depends on how much money he has  1 - Frequency: Varies 1 - Duration: He has been using on and off since he was 19 years off  1 - Last Use / Amount: 5 months ago Substance #2 Name of Substance 2: Alcohol 2 - Age of First Use: 16 2 - Amount (size/oz): Patient reprots drinking 7 liters daily 2 - Frequency: Every other day 2 - Duration: Daily 2 - Last Use / Amount: Today  CIWA: CIWA-Ar BP: 138/84 mmHg Pulse Rate: 84 COWS:    Allergies: No Known Allergies  Home Medications:  (Not in a hospital admission)  OB/GYN Status:  No LMP for male patient.  General Assessment Data Location of Assessment: [facility] ED TTS Assessment: In system Is this a Tele or Face-to-Face Assessment?: Face-to-Face Is this an Initial Assessment or a Re-assessment for this encounter?: Initial Assessment Marital status: Divorced [place] name: NA Is patient pregnant?: No Pregnancy Status: Other (Comment) (NA) Living Arrangements: Other (Comment) (Girlfriend) Can pt return to current living arrangement?: Yes Admission Status: Involuntary Is patient capable of signing voluntary admission?: No Referral Source: Other Insurance type: Medicaid  Medical Screening Exam [facility] Walk-in ONLY) Medical Exam completed: Yes  Crisis Care Plan Living Arrangements: Other (Comment) (Girlfriend) Name of Psychiatrist: Nicholos Johns Ciss Name of Therapist: None Reported  Education Status Is patient  currently in school?: No Current Grade: NA Highest grade of school patient has completed: NA Name of school: NA Contact person: NA  Risk to self with the past 6 months Suicidal Ideation: No-Not Currently/Within Last 6 Months Has patient been a risk to self within the past 6 months prior to admission? : No Suicidal Intent: No-Not Currently/Within Last 6 Months Has  patient had any suicidal intent within the past 6 months prior to admission? : No Is patient at risk for suicide?: No Suicidal Plan?: No Has patient had any suicidal plan within the past 6 months prior to admission? : No Access to Means: No What has been your use of drugs/alcohol within the last 12 months?: Beer Previous Attempts/Gestures: No How many times?: 0 Other Self Harm Risks: NA Triggers for Past Attempts:  (NA) Intentional Self Injurious Behavior: None Family Suicide History: No Recent stressful life event(s): Job Loss, Financial Problems, Other (Comment), Conflict (Comment) Persecutory voices/beliefs?: Yes Depression: Yes Depression Symptoms: Despondent, Isolating, Guilt, Feeling worthless/self pity, Feeling angry/irritable Substance abuse history and/or treatment for substance abuse?: Yes Suicide prevention information given to non-admitted patients: Not applicable  Risk to Others within the past 6 months Homicidal Ideation: No Does patient have any lifetime risk of violence toward others beyond the six months prior to admission? : No Thoughts of Harm to Others: No Current Homicidal Intent: No Current Homicidal Plan: No Access to Homicidal Means: No Identified Victim: NA History of harm to others?: No Assessment of Violence: None Noted Violent Behavior Description: NA Does patient have access to weapons?: No Criminal Charges Pending?: No Does patient have a court date: No Is patient on probation?: No  Psychosis Hallucinations: Auditory, Visual Delusions: None noted  Mental Status Report Appearance/Hygiene: Disheveled, In scrubs Eye Contact: Fair Motor Activity: Freedom of movement Speech: Logical/coherent Level of Consciousness: Alert Mood: Depressed, Anxious Affect: Anxious, Blunted, Depressed Anxiety Level: Minimal Thought Processes: Relevant, Coherent Judgement: Unimpaired Orientation: Place, Person, Situation, Time Obsessive Compulsive  Thoughts/Behaviors: None  Cognitive Functioning Concentration: Decreased Memory: Recent Intact, Remote Intact IQ: Average Insight: Fair Impulse Control: Fair Appetite: Fair Weight Loss: 0 Weight Gain: 0 Sleep: Decreased Total Hours of Sleep: 4 Vegetative Symptoms: Staying in bed, Decreased grooming  ADLScreening [facility] Assessment Services) Patient's cognitive ability adequate to safely complete daily activities?: Yes Patient able to express need for assistance with ADLs?: Yes Independently performs ADLs?: Yes (appropriate for developmental age)  Prior Inpatient Therapy Prior Inpatient Therapy: No Prior Therapy Dates: NA Prior Therapy Facilty/Provider(s): NA Reason for Treatment: NA  Prior Outpatient Therapy Prior Outpatient Therapy: Yes Prior Therapy Dates: Ongoing Prior Therapy Facilty/Provider(s): Nicholos Johns Ciss Reason for Treatment: Oupatient Medication Management Does patient have an ACCT team?: No Does patient have Intensive In-House Services?  : No Does patient have [facility] services? : No Does patient have P4CC services?: No  ADL Screening (condition at time of admission) Patient's cognitive ability adequate to safely complete daily activities?: Yes Is the patient deaf or have difficulty hearing?: No Does the patient have difficulty seeing, even when wearing glasses/contacts?: No Does the patient have difficulty concentrating, remembering, or making decisions?: No Patient able to express need for assistance with ADLs?: Yes Does the patient have difficulty dressing or bathing?: No Independently performs ADLs?: Yes (appropriate for developmental age) Does the patient have difficulty walking or climbing stairs?: No Weakness of Legs: None Weakness of Arms/Hands: None  Home Assistive Devices/Equipment Home Assistive Devices/Equipment: None    Abuse/Neglect Assessment (Assessment to be complete while patient is alone)  Physical Abuse: Denies Verbal Abuse:  Denies Sexual Abuse: Denies Exploitation of patient/patient's resources: Denies Self-Neglect: Denies Values / Beliefs Cultural Requests During Hospitalization: None Spiritual Requests During Hospitalization: None Consults Spiritual Care Consult Needed: No Social Work Consult Needed: No Merchant navy officer (For Healthcare) Does patient have an advance directive?: No Would patient like information on creating an advanced directive?: No - patient declined information    Additional Information 1:1 In Past 12 Months?: No CIRT Risk: No Elopement Risk: No Does patient have medical clearance?: Yes     Disposition: Pending psych disposition. Disposition Initial Assessment Completed for this Encounter: Yes Disposition of Patient: Other dispositions (Pending psych disposition.)  On Site Evaluation by:   Reviewed with Physician:    Phillip Heal LaVerne 05/25/2015 8:29 PM

## 2015-05-25 NOTE — ED Notes (Signed)
BEHAVIORAL HEALTH ROUNDING Patient sleeping: No. Patient alert and oriented: yes Behavior appropriate: Yes.  ; If no, describe:  Nutrition and fluids offered: yes Toileting and hygiene offered: Yes  Sitter present: q15 minute observations and security camera monitoring Law enforcement present: Yes  ODS  

## 2015-05-25 NOTE — ED Notes (Signed)
Pt informed of IVC guidelines.  Pt became irritable and demanding to leave and started to take IV out.  Pt informed that he could not leave and to not rip IV out.  Pt stood up off of bed and cursing at staff members, security called for assistance.  This nurse removed IV safely while pt was sat back on bed and convinced to stay.

## 2015-05-25 NOTE — ED Notes (Signed)
md Paduchowski notified of pt trauma to his right hand  Xray ordered

## 2015-05-25 NOTE — ED Notes (Signed)
Pt has been up to the BR  - blood droplets noted all the way from his room and in the BR on the walls and floor

## 2015-05-25 NOTE — ED Notes (Signed)
BEHAVIORAL HEALTH ROUNDING Patient sleeping: Yes.   Patient alert and oriented: not applicable Behavior appropriate: Yes.   Nutrition and fluids offered: Yes  Toileting and hygiene offered: Yes  Sitter present: yes Law enforcement present: Yes  

## 2015-05-25 NOTE — ED Notes (Signed)
BEHAVIORAL HEALTH ROUNDING  Patient sleeping: No.  Patient alert and oriented: yes  Behavior appropriate: Yes. ; If no, describe:  Nutrition and fluids offered: Yes  Toileting and hygiene offered: Yes  Sitter present: not applicable  Law enforcement present: Yes ODS  

## 2015-05-25 NOTE — ED Provider Notes (Addendum)
[facility] Emergency Department Provider Note  Time seen: 5:49 PM  I have reviewed the triage vital signs and the nursing notes.   HISTORY  Chief Complaint Drug Overdose    HPI Seth [name] is a 44 y.o. male with a past medical history of hypertension, seizures, chronic pain who presents the emergency department after taking 4-5 Valium, and drinking "8 beers." When asked if he was trying to hurt himself the patient states no fevers not trying to hurt himself he is just been very depressed lately. He also states he has been having chest pain the last several weeks, and intermittent abdominal pains as well. Patient states the Valium is prescribed to him. Again denies any SI or HI, but was taking more Valium then prescribed and drinking excessive amounts of alcohol, and admits to depression.     Past Medical History  Diagnosis Date  . Hypertension   . Seizures   . Cirrhosis   . Chronic back pain     Patient Active Problem List   Diagnosis Date Noted  . 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 Seizure disorder 02/04/2013  . Hypertension 02/04/2013  . Hyponatremia 02/04/2013  . Hypokalemia 02/04/2013    History reviewed. No pertinent past surgical history.  Current Outpatient Rx  Name  Route  Sig  Dispense  Refill  . amLODipine (NORVASC) 5 MG tablet   Oral   Take 5 mg by mouth daily.         . diazepam (VALIUM) 5 MG tablet   Oral   Take 5 mg by mouth every 6 (six) hours as needed for anxiety.         . levETIRAcetam (KEPPRA) 1000 MG tablet   Oral   Take 1,000 mg by mouth 2 (two) times daily.         Marland Kitchen levETIRAcetam (KEPPRA) 1000 MG tablet   Oral   Take 1 tablet (1,000 mg total) by mouth 2 (two) times daily.   60 tablet   1   . omeprazole (PRILOSEC) 20 MG capsule   Oral   Take 20 mg by mouth daily.         Marland Kitchen oxyCODONE-acetaminophen (PERCOCET/ROXICET) 5-325 MG per tablet   Oral   Take 1-2 tablets by mouth every 4  (four) hours as needed.   100 tablet   0   . potassium chloride SA (K-DUR,KLOR-CON) 20 MEQ tablet   Oral   Take 1 tablet (20 mEq total) by mouth 3 (three) times daily.   60 tablet   0     Allergies Review of patient's allergies indicates no known allergies.  History reviewed. No pertinent family history.  Social History History  Substance Use Topics  . Smoking status: Current Every Day Smoker -- 0.25 packs/day for 25 years    Types: Cigarettes  . Smokeless tobacco: Not on file  . Alcohol Use: 4.8 oz/week    8 Cans of beer per week    Review of Systems Constitutional: Negative for fever. Cardiovascular: Positive for chest pain. Respiratory: Negative for shortness of breath. Gastrointestinal: Positive for intermittent abdominal pain, negative for nausea/vomiting. Genitourinary: Negative for dysuria. Musculoskeletal: Positive for chronic back pain. 10-point ROS otherwise negative.  ____________________________________________   PHYSICAL EXAM:  VITAL SIGNS: ED Triage Vitals  Enc Vitals Group     BP 05/25/15 1716 138/84 mmHg     Pulse Rate 05/25/15 1716 84     Resp 05/25/15 1716 12     Temp  05/25/15 1716 97.9 F (36.6 C)     Temp Source 05/25/15 1716 Oral     SpO2 05/25/15 1716 93 %     Weight 05/25/15 1716 170 lb (77.111 kg)     Height 05/25/15 1716  (1.676 m)     Head Cir --      Peak Flow --      Pain Score 05/25/15 1718 10     Pain Loc --      Pain Edu? --      Excl. in GC? --     Constitutional: Alert and oriented. Somnolent, but will awaken and answer questions appropriately. Following commands. ENT   Head: Normocephalic and atraumatic.   Mouth/Throat: Mucous membranes are moist. Cardiovascular: Normal rate, regular rhythm. No murmur Respiratory: Normal respiratory effort without tachypnea nor retractions. Breath sounds are clear Gastrointestinal: Soft and nontender. No distention. No rebound or guarding. Musculoskeletal: Appears to move  all extremities without issue. Neurologic:  Clear speech and language. No gross deficit identified. Moving all extremities. Patient does appear somnolent, smells of alcohol and likely intoxicated. Skin:  Skin is warm, dry and intact.  Psychiatric: Patient is soft spoken, admits to depression but denies SI or HI.  ____________________________________________    EKG  EKG reviewed and interpreted by myself shows normal sinus rhythm at 74 bpm, narrow QRS, normal axis, normal intervals, no ST changes noted.  ____________________________________________     INITIAL IMPRESSION / ASSESSMENT AND PLAN / ED COURSE  Pertinent labs & imaging results that were available during my care of the patient were reviewed by me and considered in my medical decision making (see chart for details).  Patient with admitted alcohol and Valium ingestion. Denies any other drugs or substances. When asked why he took that much Valium and alcohol he states he is just very depressed but denies SI or HI. Given his degree of alcohol intoxication, admits depression, and possible intentional overdose we will involuntarily commit the patient until he can be properly evaluated by psychiatry. Labs are largely within normal limits besides elevated blood alcohol level of 303. Urine toxicology is positive for benzodiazepine's and cannabinoids. Currently awaiting psychiatric evaluation.    ----------------------------------------- 10:12 PM on 05/25/2015 -----------------------------------------  Upon being transferred to the behavioral health unit, the patient became very agitated, punched the wall several times and now has swelling and abrasions to the right hand. Patient calm down on his own, did not require sedation. We will x-ray his right hand as a precaution.  ____________________________________________   FINAL CLINICAL IMPRESSION(S) / ED DIAGNOSES  Substance abuse Alcohol intoxication Involuntary  commitment Depression   Minna Antis, MD 05/25/15 2117  Minna Antis, MD 05/25/15 2213

## 2015-05-26 MED ORDER — LORAZEPAM 2 MG PO TABS
ORAL_TABLET | ORAL | Status: AC
  Administered 2015-05-26: 2 mg via ORAL
  Filled 2015-05-26: qty 1

## 2015-05-26 NOTE — ED Notes (Signed)
While pt was speaking to someone he had called, his mother called to speak with pt.  RN took the phone to him and he ended the call he had made and spoke with his mother.

## 2015-05-26 NOTE — ED Notes (Signed)
Pt's mother is visiting with pt.

## 2015-05-26 NOTE — ED Notes (Signed)
BEHAVIORAL HEALTH ROUNDING  Patient sleeping: Yes.  Patient alert and oriented: no  Behavior appropriate: Yes. ; If no, describe:  Nutrition and fluids offered: No  Toileting and hygiene offered: No  Sitter present: no  Law enforcement present: Yes   

## 2015-05-26 NOTE — ED Notes (Signed)
CIWA total = 10. (2 mg lorazepam) (Program would not allow entry in total area.  IT called.)

## 2015-05-26 NOTE — ED Notes (Signed)
Pt's girlfriend visiting pt.

## 2015-05-26 NOTE — ED Provider Notes (Signed)
Patient seen and evaluate by psychiatry, Dr. Guss Bunde.  Dr. Guss Bunde recommends discharge. Diagnosis of alcohol abuse. In addition, patient is to stay on his seizure medications and follow-up with Dr. Malvin Johns.  Sharyn Creamer, MD 05/26/15 [phone]

## 2015-05-26 NOTE — ED Notes (Signed)
Pt on a phone call that he placed, and, again, his mother called.  Pt told RN to tell his mother that he would call her back,

## 2015-05-26 NOTE — ED Notes (Signed)
BEHAVIORAL HEALTH ROUNDING Patient sleeping: No. Patient alert and oriented: yes Behavior appropriate: Yes.  ; If no, describe:  Nutrition and fluids offered: Yes  Toileting and hygiene offered: Yes  Sitter present: not applicable Law enforcement present: Yes  

## 2015-05-26 NOTE — ED Notes (Signed)
Pt sitting in dayroom; interacting with another pt.

## 2015-05-26 NOTE — ED Notes (Signed)
RN accompanied MD to consult with pt.

## 2015-05-26 NOTE — ED Notes (Signed)
BEHAVIORAL HEALTH ROUNDING Patient sleeping: No. Patient alert and oriented: yes Behavior appropriate: Yes.  ; If no, describe:  Nutrition and fluids offered: Yes  Toileting and hygiene offered: Yes  Sitter present: not applicable Law enforcement present: Yes ODS/shift 

## 2015-05-26 NOTE — ED Notes (Signed)
.  BEHAVIORAL HEALTH ROUNDING Patient sleeping: No. Patient alert and oriented: yes Behavior appropriate: No.; If no, describe: Pt. States he is stressed and agitated. Nutrition and fluids offered: Yes  Toileting and hygiene offered: Yes  Sitter present: no Law enforcement present: Yes

## 2015-05-26 NOTE — ED Notes (Signed)

## 2015-05-26 NOTE — ED Notes (Signed)
ED BHU PLACEMENT JUSTIFICATION Is the patient under IVC or is there intent for IVC: No. Is the patient medically cleared: Yes.   Is there vacancy in the ED BHU: Yes.   Is the population mix appropriate for patient: Yes.   Is the patient awaiting placement in inpatient or outpatient setting: No. Has the patient had a psychiatric consult: No. Survey of unit performed for contraband, proper placement and condition of furniture, tampering with fixtures in bathroom, shower, and each patient room: Yes.  ; Findings:  APPEARANCE/BEHAVIOR calm, cooperative and adequate rapport can be established NEURO ASSESSMENT Orientation: time, place and person Hallucinations: No.None noted (Hallucinations) Speech: Normal Gait: normal RESPIRATORY ASSESSMENT Normal expansion.  Clear to auscultation.  No rales, rhonchi, or wheezing. CARDIOVASCULAR ASSESSMENT regular rate and rhythm, S1, S2 normal, no murmur, click, rub or gallop GASTROINTESTINAL ASSESSMENT soft, nontender, BS WNL, no r/g EXTREMITIES normal strength, tone, and muscle mass PLAN OF CARE Provide calm/safe environment. Vital signs assessed twice daily. ED BHU Assessment once each 12-hour shift. Collaborate with intake RN daily or as condition indicates. Assure the ED provider has rounded once each shift. Provide and encourage hygiene. Provide redirection as needed. Assess for escalating behavior; address immediately and inform ED provider.  Assess family dynamic and appropriateness for visitation as needed: Yes.  ; If necessary, describe findings:  Educate the patient/family about BHU procedures/visitation: Yes.  ; If necessary, describe findings:  

## 2015-05-26 NOTE — Discharge Instructions (Signed)
Follow-up with [facility] tomorrow.  Return to the ER right away should you develop thoughts of hurting herself or anyone else. Do not drink alcohol. Do not use any illegal drugs. Do not drive while under the influence of alcohol or while using any illegal substances.

## 2015-05-26 NOTE — ED Notes (Signed)
Pt. States he takes his anti-seizure medication(Vimpat) morning and night, pt. States he took morning dose but did not take evening dose.  Informed Dr. Dolores Frame, gave authorization to check pt. Medication and verify.  Verified medication dose and gave pt. Required medication.

## 2015-05-26 NOTE — Consult Note (Signed)
[name]   Reason for Consult:  Follow up Referring Physician:  ER Patient Identification: Seth [name] MRN:  [id number] Principal Diagnosis: Alcohol abuse with Intoxication. MDD Recurrent. Diagnosis:   Patient Active Problem List   Diagnosis Date Noted  . 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 Seizure disorder [G40.909] 02/04/2013  . Hypertension [I10] 02/04/2013  . Hyponatremia [E87.1] 02/04/2013  . Hypokalemia [E87.6] 02/04/2013    Total Time spent with patient: 45 minutes  Subjective:   ODAI Seth [name] is a 44 y.o. male patient admitted with alcohol abuse and intoxication and drank 12 beers last night and mother gotr concerned as he had seizures and was depressed about being separated from wife of  many yrs because . Of her using drugs. Was depressed and and started drinking beer.  HPI:  Pt is being followed at [facility] by Dr. Dorann Ou. Next apt is in one wk and has enough meds at home. Pt has apt with Dr. Melrose Nakayama for seizure disorder at [facility].  HPI Elements:     Past Medical History:  Past Medical History  Diagnosis Date  . Hypertension   . Seizures   . Cirrhosis   . Chronic back pain    History reviewed. No pertinent past surgical history. Family History: History reviewed. No pertinent family history. Social History:  History  Alcohol Use  . 4.8 oz/week  . 8 Cans of beer per week     History  Drug Use  . Yes  . Special: Marijuana    History   Social History  . Marital Status: Married    Spouse Name: N/A  . Number of Children: N/A  . Years of Education: N/A   Social History Main Topics  . Smoking status: Current Every Day Smoker -- 0.25 packs/day for 25 years    Types: Cigarettes  . Smokeless tobacco: Not on file  . Alcohol Use: 4.8 oz/week    8 Cans of beer per week  . Drug Use: Yes    Special: Marijuana  . Sexual Activity: Yes   Other Topics Concern  . None   Social History Narrative    Additional Social History:    History of alcohol / drug use?: Yes Longest period of sobriety (when/how long): Patient reprots that he is unable to remember  Negative Consequences of Use: Financial, Personal relationships, Work / Youth worker Withdrawal Symptoms:  (Patient denies withdrawl symptoms ) Name of Substance 1: Cocaine 1 - Age of First Use: 19 1 - Amount (size/oz): varies depends on how much money he has  1 - Frequency: Varies 1 - Duration: He has been using on and off since he was 19 years off  1 - Last Use / Amount: 5 months ago Name of Substance 2: Alcohol 2 - Age of First Use: 16 2 - Amount (size/oz): Patient reprots drinking 7 liters daily 2 - Frequency: Every other day 2 - Duration: Daily 2 - Last Use / Amount: Today                 Allergies:  No Known Allergies  Labs:  Results for orders placed or performed during the hospital encounter of 05/25/15 (from the past 48 hour(s))  Comprehensive metabolic panel     Status: Abnormal   Collection Time: 05/25/15  6:24 PM  Result Value Ref Range   Sodium 142 135 - 145 mmol/L   Potassium 3.6 3.5 - 5.1 mmol/L   Chloride 103 101 - 111 mmol/L  CO2 26 22 - 32 mmol/L   Glucose, Bld 86 65 - 99 mg/dL   BUN 6 6 - 20 mg/dL   Creatinine, Ser 0.74 0.61 - 1.24 mg/dL   Calcium 8.7 (L) 8.9 - 10.3 mg/dL   Total Protein 7.4 6.5 - 8.1 g/dL   Albumin 3.9 3.5 - 5.0 g/dL   AST 104 (H) 15 - 41 U/L   ALT 64 (H) 17 - 63 U/L   Alkaline Phosphatase 147 (H) 38 - 126 U/L   Total Bilirubin 0.5 0.3 - 1.2 mg/dL   GFR calc non Af Amer >60 >60 mL/min   GFR calc Af Amer >60 >60 mL/min    Comment: (NOTE) The eGFR has been calculated using the CKD EPI equation. This calculation has not been validated in all clinical situations. eGFR's persistently <60 mL/min signify possible Chronic Kidney Disease.    Anion gap 13 5 - 15  Acetaminophen level     Status: Abnormal   Collection Time: 05/25/15  6:24 PM  Result Value Ref Range   Acetaminophen  (Tylenol), Serum <10 (L) 10 - 30 ug/mL    Comment:        THERAPEUTIC CONCENTRATIONS VARY SIGNIFICANTLY. A RANGE OF 10-30 ug/mL MAY BE AN EFFECTIVE CONCENTRATION FOR MANY PATIENTS. HOWEVER, SOME ARE BEST TREATED AT CONCENTRATIONS OUTSIDE THIS RANGE. ACETAMINOPHEN CONCENTRATIONS >150 ug/mL AT 4 HOURS AFTER INGESTION AND >50 ug/mL AT 12 HOURS AFTER INGESTION ARE OFTEN ASSOCIATED WITH TOXIC REACTIONS.   Salicylate level     Status: None   Collection Time: 05/25/15  6:24 PM  Result Value Ref Range   Salicylate Lvl 7.0 2.8 - 30.0 mg/dL  Ethanol     Status: Abnormal   Collection Time: 05/25/15  6:24 PM  Result Value Ref Range   Alcohol, Ethyl (B) 303 (HH) <5 mg/dL    Comment: CRITICAL RESULT CALLED TO, READ BACK BY AND VERIFIED WITH  ANN CALES AT 2030 05/25/15 SDR        LOWEST DETECTABLE LIMIT FOR SERUM ALCOHOL IS 5 mg/dL FOR MEDICAL PURPOSES ONLY   CBC with Differential     Status: Abnormal   Collection Time: 05/25/15  6:24 PM  Result Value Ref Range   WBC 4.8 3.8 - 10.6 K/uL   RBC 4.68 4.40 - 5.90 MIL/uL   Hemoglobin 11.2 (L) 13.0 - 18.0 g/dL   HCT 36.2 (L) 40.0 - 52.0 %   MCV 77.3 (L) 80.0 - 100.0 fL   MCH 23.9 (L) 26.0 - 34.0 pg   MCHC 31.0 (L) 32.0 - 36.0 g/dL   RDW 20.9 (H) 11.5 - 14.5 %   Platelets 238 150 - 440 K/uL   Neutrophils Relative % 57 %   Neutro Abs 2.7 1.4 - 6.5 K/uL   Lymphocytes Relative 32 %   Lymphs Abs 1.5 1.0 - 3.6 K/uL   Monocytes Relative 9 %   Monocytes Absolute 0.4 0.2 - 1.0 K/uL   Eosinophils Relative 1 %   Eosinophils Absolute 0.1 0 - 0.7 K/uL   Basophils Relative 1 %   Basophils Absolute 0.1 0 - 0.1 K/uL  Troponin I     Status: None   Collection Time: 05/25/15  6:24 PM  Result Value Ref Range   Troponin I <0.03 <0.031 ng/mL    Comment:        NO INDICATION OF MYOCARDIAL INJURY.   Urinalysis complete, with microscopic     Status: Abnormal   Collection Time: 05/25/15  6:24 PM  Result  Value Ref Range   Color, Urine STRAW (A) YELLOW    APPearance CLEAR (A) CLEAR   Glucose, UA NEGATIVE NEGATIVE mg/dL   Bilirubin Urine NEGATIVE NEGATIVE   Ketones, ur NEGATIVE NEGATIVE mg/dL   Specific Gravity, Urine 1.002 (L) 1.005 - 1.030   Hgb urine dipstick NEGATIVE NEGATIVE   pH 5.0 5.0 - 8.0   Protein, ur 100 (A) NEGATIVE mg/dL   Nitrite NEGATIVE NEGATIVE   Leukocytes, UA NEGATIVE NEGATIVE   RBC / HPF NONE SEEN 0 - 5 RBC/hpf   WBC, UA 0-5 0 - 5 WBC/hpf   Bacteria, UA NONE SEEN NONE SEEN   Squamous Epithelial / LPF NONE SEEN NONE SEEN   Mucous PRESENT   Urine Drug Screen, Qualitative     Status: Abnormal   Collection Time: 05/25/15  6:24 PM  Result Value Ref Range   Tricyclic, Ur Screen NONE DETECTED NONE DETECTED   Amphetamines, Ur Screen NONE DETECTED NONE DETECTED   MDMA (Ecstasy)Ur Screen NONE DETECTED NONE DETECTED   Cocaine Metabolite,Ur [facility] NONE DETECTED NONE DETECTED   Opiate, Ur Screen NONE DETECTED NONE DETECTED   Phencyclidine (PCP) Ur S NONE DETECTED NONE DETECTED   Cannabinoid 50 Ng, Ur  POSITIVE (A) NONE DETECTED   Barbiturates, Ur Screen NONE DETECTED NONE DETECTED   Benzodiazepine, Ur Scrn POSITIVE (A) NONE DETECTED   Methadone Scn, Ur NONE DETECTED NONE DETECTED    Comment: (NOTE) 270  Tricyclics, urine               Cutoff 1000 ng/mL 200  Amphetamines, urine             Cutoff 1000 ng/mL 300  MDMA (Ecstasy), urine           Cutoff 500 ng/mL 400  Cocaine Metabolite, urine       Cutoff 300 ng/mL 500  Opiate, urine                   Cutoff 300 ng/mL 600  Phencyclidine (PCP), urine      Cutoff 25 ng/mL 700  Cannabinoid, urine              Cutoff 50 ng/mL 800  Barbiturates, urine             Cutoff 200 ng/mL 900  Benzodiazepine, urine           Cutoff 200 ng/mL 1000 Methadone, urine                Cutoff 300 ng/mL 1100 1200 The urine drug screen provides only a preliminary, unconfirmed 1300 analytical test result and should not be used for non-medical 1400 purposes. Clinical consideration and  professional judgment should 1500 be applied to any positive drug screen result due to possible 1600 interfering substances. A more specific alternate chemical method 1700 must be used in order to obtain a confirmed analytical result.  1800 Gas chromato graphy / mass spectrometry (GC/MS) is the preferred 1900 confirmatory method.     Vitals: Blood pressure 157/80, pulse 92, temperature 97.9 F (36.6 C), temperature source Oral, resp. rate 18, height 5' 6"  (1.676 m), weight 77.111 kg (170 lb), SpO2 100 %.  Risk to Self: Suicidal Ideation: No-Not Currently/Within Last 6 Months Suicidal Intent: No-Not Currently/Within Last 6 Months Is patient at risk for suicide?: No Suicidal Plan?: No Access to Means: No What has been your use of drugs/alcohol within the last 12 months?: Beer How many times?: 0 Other Self Harm Risks: NA Triggers  for Past Attempts:  (NA) Intentional Self Injurious Behavior: None Risk to Others: Homicidal Ideation: No Thoughts of Harm to Others: No Current Homicidal Intent: No Current Homicidal Plan: No Access to Homicidal Means: No Identified Victim: NA History of harm to others?: No Assessment of Violence: None Noted Violent Behavior Description: NA Does patient have access to weapons?: No Criminal Charges Pending?: No Does patient have a court date: No Prior Inpatient Therapy: Prior Inpatient Therapy: No Prior Therapy Dates: NA Prior Therapy Facilty/Provider(s): NA Reason for Treatment: NA Prior Outpatient Therapy: Prior Outpatient Therapy: Yes Prior Therapy Dates: Ongoing Prior Therapy Facilty/Provider(s): [facility] Reason for Treatment: Oupatient Medication Management Does patient have an ACCT team?: No Does patient have Intensive In-House Services?  : No Does patient have [facility] services? : No Does patient have P4CC services?: No  Current Facility-Administered Medications  Medication Dose Route Frequency Provider Last Rate Last Dose  .  LORazepam (ATIVAN) injection 0-4 mg  0-4 mg Intravenous Q6H PRN Harvest Dark, MD      . LORazepam (ATIVAN) tablet 0-4 mg  0-4 mg Oral Q6H PRN Harvest Dark, MD   2 mg at 05/26/15 0926  . thiamine (VITAMIN B-1) tablet 100 mg  100 mg Oral Daily Harvest Dark, MD   100 mg at 05/26/15 4034   Current Outpatient Prescriptions  Medication Sig Dispense Refill  . amLODipine (NORVASC) 10 MG tablet Take 10 mg by mouth daily.  11  . carvedilol (COREG) 3.125 MG tablet Take 3.125 mg by mouth 2 (two) times daily with a meal.    . chlorthalidone (HYGROTON) 25 MG tablet Take 25 mg by mouth daily.  10  . diazepam (VALIUM) 5 MG tablet Take 5 mg by mouth 3 (three) times daily.    Marland Kitchen gabapentin (NEURONTIN) 400 MG capsule Take 400 mg by mouth 3 (three) times daily.  11  . mirtazapine (REMERON) 30 MG tablet Take 30 mg by mouth at bedtime.  3  . omeprazole (PRILOSEC) 20 MG capsule Take 20 mg by mouth daily.    . sucralfate (CARAFATE) 1 G tablet Take 1 g by mouth 4 (four) times daily.    Marland Kitchen VIMPAT 200 MG TABS tablet Take 200 mg by mouth daily.  5  . levETIRAcetam (KEPPRA) 1000 MG tablet Take 1 tablet (1,000 mg total) by mouth 2 (two) times daily. 60 tablet 1  . oxyCODONE-acetaminophen (PERCOCET/ROXICET) 5-325 MG per tablet Take 1-2 tablets by mouth every 4 (four) hours as needed. 100 tablet 0  . potassium chloride SA (K-DUR,KLOR-CON) 20 MEQ tablet Take 1 tablet (20 mEq total) by mouth 3 (three) times daily. 60 tablet 0    Musculoskeletal: Strength & Muscle Tone: within normal limits Gait & Station: normal Patient leans: N/A  Psychiatric Specialty Exam: Physical Exam  Review of Systems  Constitutional: Negative.   HENT: Negative.   Eyes: Negative.   Respiratory: Negative.   Cardiovascular: Negative.   Gastrointestinal: Negative.   Genitourinary: Negative.   Musculoskeletal: Negative.   Skin: Negative.   Neurological: Positive for seizures.  Endo/Heme/Allergies: Negative.    Psychiatric/Behavioral: Positive for substance abuse. The patient is nervous/anxious.   All other systems reviewed and are negative.   Blood pressure 157/80, pulse 92, temperature 97.9 F (36.6 C), temperature source Oral, resp. rate 18, height 5' 6"  (1.676 m), weight 77.111 kg (170 lb), SpO2 100 %.Body mass index is 27.45 kg/(m^2).  General Appearance: Casual  Eye Contact::  Fair  Speech:  Clear and Coherent  Volume:  Normal  Mood:  Anxious  Affect:  Appropriate  Thought Process:  Coherent  Orientation:  Full (Time, Place, and Person)  Thought Content:  WDL  Suicidal Thoughts:  No  Homicidal Thoughts:  No  Memory:  Immediate;   Fair Recent;   Fair Remote;   Fair adequate  Judgement:  Fair  Insight:  Fair  Psychomotor Activity:  Normal  Concentration:  Fair  Recall:  [employer] [place]  Language: Fair  Akathisia:  No  Handed:  Right  AIMS (if indicated):     Assets:  Communication Skills Desire for Improvement Social Support Talents/Skills  ADL's:  Intact  Cognition: WNL  Sleep:      Medical Decision Making: Established Problem, Stable/Improving (1)  Treatment Plan Summary: Plan D/C IVC and discharge pt home and will keep follow up apt at [facility].  Plan:  No evidence of imminent risk to self or others at present.   Disposition: as above.  Dewain Penning 05/26/2015 3:45 PM

## 2015-05-26 NOTE — ED Provider Notes (Signed)
-----------------------------------------   7:17 AM on 05/26/2015 -----------------------------------------   BP 140/77 mmHg  Pulse 92  Temp(Src) 97.7 F (36.5 C) (Oral)  Resp 22  Ht 5\' 6"  (1.676 m)  Wt 170 lb (77.111 kg)  BMI 27.45 kg/m2  SpO2 93%  Although patient apparently punched a wall upon going to the BHU no acute events since then. Vitals reviewed. Patient remains medically cleared.  Disposition is pending per Psychiatry/Behavioral Medicine team recommendations.   Jene Every, MD 05/26/15 [phone]

## 2015-05-26 NOTE — ED Notes (Signed)
Pt feels less agitated following 2 mg of lorazepam.  He is resting comfortably in his room.

## 2015-06-12 ENCOUNTER — Emergency Department
Admission: EM | Admit: 2015-06-12 | Discharge: 2015-06-12 | Disposition: A | Discharge status: Home / Self Care | Payer: Medicaid Other | Attending: Emergency Medicine | Admitting: Emergency Medicine

## 2015-06-12 ENCOUNTER — Other Ambulatory Visit: Payer: Self-pay

## 2015-06-12 DIAGNOSIS — I10 Essential (primary) hypertension: Secondary | ICD-10-CM | POA: Diagnosis not present

## 2015-06-12 DIAGNOSIS — Z720 Tobacco use: Secondary | ICD-10-CM | POA: Diagnosis not present

## 2015-06-12 DIAGNOSIS — R569 Unspecified convulsions: Secondary | ICD-10-CM | POA: Diagnosis present

## 2015-06-12 DIAGNOSIS — G40909 Epilepsy, unspecified, not intractable, without status epilepticus: Secondary | ICD-10-CM | POA: Diagnosis not present

## 2015-06-12 DIAGNOSIS — Z79899 Other long term (current) drug therapy: Secondary | ICD-10-CM | POA: Diagnosis not present

## 2015-06-12 LAB — GLUCOSE, CAPILLARY: Glucose-Capillary: 108 mg/dL — ABNORMAL HIGH (ref 65–99)

## 2015-06-12 NOTE — ED Notes (Signed)
Pt here after having a witnessed seizure this afternoon, pt has hx of the same. Pt states that he has been taking meds like prescribed. Pts last seizure was 6 weekss ago. Pt

## 2015-06-12 NOTE — ED Notes (Signed)
Pt here after having a witnessed seizure today. See triage note for full details. Pt back at baseline at this time. Pt requesting to go home. Provider informed

## 2015-06-12 NOTE — ED Provider Notes (Signed)
[facility] Emergency Department Provider Note  ____________________________________________  Time seen: Approximately 4:57 PM  I have reviewed the triage vital signs and the nursing notes.   HISTORY  Chief Complaint Seizures    HPI Seth [name] is a 44 y.o. male Patient reports he had his usual regular seizure. He is now back to baseline. He is taking his medicines. He takes Vimpat. He sees Dr. Malvin JohnsPotter. He does not want to wait any longer he does not want me to call Dr. Malvin JohnsPotter at this point. He says he promises with his right hand raise to call Dr. Malvin JohnsPotter tomorrow to see if he needs to increase his medicines. He reports his friend was with him. His friend caught him as soon as the seizure began so he did not hurt himself. He says he has no complaints except for feeling stiff and sore all over is usually does when he has his seizures.   Past Medical History  Diagnosis Date  . Hypertension   . Seizures   . Cirrhosis   . Chronic back pain     Patient Active Problem List   Diagnosis Date Noted  . 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 Seizure disorder 02/04/2013  . Hypertension 02/04/2013  . Hyponatremia 02/04/2013  . Hypokalemia 02/04/2013    No past surgical history on file.  Current Outpatient Rx  Name  Route  Sig  Dispense  Refill  . amLODipine (NORVASC) 10 MG tablet   Oral   Take 10 mg by mouth daily.      11   . carvedilol (COREG) 3.125 MG tablet   Oral   Take 3.125 mg by mouth 2 (two) times daily with a meal.         . chlorthalidone (HYGROTON) 25 MG tablet   Oral   Take 25 mg by mouth daily.      10   . diazepam (VALIUM) 5 MG tablet   Oral   Take 5 mg by mouth 3 (three) times daily.         Marland Kitchen. gabapentin (NEURONTIN) 400 MG capsule   Oral   Take 400 mg by mouth 3 (three) times daily.      11   . levETIRAcetam (KEPPRA) 1000 MG tablet   Oral   Take 1 tablet (1,000 mg total) by mouth 2 (two) times daily.  60 tablet   1   . mirtazapine (REMERON) 30 MG tablet   Oral   Take 30 mg by mouth at bedtime.      3   . omeprazole (PRILOSEC) 20 MG capsule   Oral   Take 20 mg by mouth daily.         Marland Kitchen. oxyCODONE-acetaminophen (PERCOCET/ROXICET) 5-325 MG per tablet   Oral   Take 1-2 tablets by mouth every 4 (four) hours as needed.   100 tablet   0   . potassium chloride SA (K-DUR,KLOR-CON) 20 MEQ tablet   Oral   Take 1 tablet (20 mEq total) by mouth 3 (three) times daily.   60 tablet   0   . sucralfate (CARAFATE) 1 G tablet   Oral   Take 1 g by mouth 4 (four) times daily.         Marland Kitchen. VIMPAT 200 MG TABS tablet   Oral   Take 200 mg by mouth daily.      5     Dispense as written.     Allergies Review of patient's allergies indicates  no known allergies.  No family history on file.  Social History History  Substance Use Topics  . Smoking status: Current Every Day Smoker -- 0.25 packs/day for 25 years    Types: Cigarettes  . Smokeless tobacco: Not on file  . Alcohol Use: 4.8 oz/week    8 Cans of beer per week    Review of Systems Constitutional: No fever/chills Eyes: No visual changes. ENT: No sore throat. Cardiovascular: Denies chest pain. Respiratory: Denies shortness of breath. Gastrointestinal: No abdominal pain.  No nausea, no vomiting.  No diarrhea.  No constipation. Genitourinary: Negative for dysuria. Musculoskeletal: Negative for back pain. Skin: Negative for rash. Neurological: Negative for headaches, focal weakness or numbness.  10-point ROS otherwise negative.  ____________________________________________   PHYSICAL EXAM:  VITAL SIGNS: ED Triage Vitals  Enc Vitals Group     BP 06/12/15 1606 144/74 mmHg     Pulse Rate 06/12/15 1606 96     Resp --      Temp 06/12/15 1606 98.7 F (37.1 C)     Temp Source 06/12/15 1606 Oral     SpO2 06/12/15 1606 93 %     Weight 06/12/15 1606 178 lb (80.74 kg)     Height 06/12/15 1606  (1.702 m)     Head  Cir --      Peak Flow --      Pain Score 06/12/15 1606 8     Pain Loc --      Pain Edu? --      Excl. in GC? --     Constitutional: Alert and oriented. Well appearing and in no acute distress. Eyes: Conjunctivae are normal. PERRL. EOMI. Head: Atraumatic. Nose: No congestion/rhinnorhea. Mouth/Throat: Mucous membranes are moist.   Neck: No stridor.  Respiratory: Normal respiratory effort.  No retractions.  Neurologic:  Normal speech and language. No gross focal neurologic deficits are appreciated. Speech is normal.  Skin:  Skin is warm, dry and intact. No rash noted. Psychiatric: Mood and affect are normal. Speech and behavior are normal.  ____________________________________________   LABS (all labs ordered are listed, but only abnormal results are displayed)  Labs Reviewed  GLUCOSE, CAPILLARY - Abnormal; Notable for the following:    Glucose-Capillary 108 (*)    All other components within normal limits  CBG MONITORING, ED   ____________________________________________  EKG  EKG normal sinus rhythm normal axis nonspecific ST-T wave changes EKG was read and interpreted by me ____________________________________________  RADIOLOGY   ____________________________________________   PROCEDURES  Procedure(s) performed  Critical Care performed:  ____________________________________________   INITIAL IMPRESSION / ASSESSMENT AND PLAN / ED COURSE  Pertinent labs & imaging results that were available during my care of the patient were reviewed by me and considered in my medical decision making (see chart for details).   ____________________________________________   FINAL CLINICAL IMPRESSION(S) / ED DIAGNOSES  Final diagnoses:  Seizure      Arnaldo Natal, MD 06/12/15 2115

## 2015-06-12 NOTE — Discharge Instructions (Signed)
Please call Dr Malvin JohnsPotter tomorrow as we discussed and ask him if you should increase your vimpat. Please return for any further problems.

## 2015-06-14 ENCOUNTER — Other Ambulatory Visit: Payer: Self-pay

## 2015-06-14 ENCOUNTER — Emergency Department
Admission: EM | Admit: 2015-06-14 | Discharge: 2015-06-14 | Disposition: A | Discharge status: Home / Self Care | Payer: Medicaid Other | Attending: Emergency Medicine | Admitting: Emergency Medicine

## 2015-06-14 ENCOUNTER — Encounter: Payer: Self-pay | Admitting: Emergency Medicine

## 2015-06-14 DIAGNOSIS — Z720 Tobacco use: Secondary | ICD-10-CM | POA: Insufficient documentation

## 2015-06-14 DIAGNOSIS — E876 Hypokalemia: Secondary | ICD-10-CM

## 2015-06-14 DIAGNOSIS — I10 Essential (primary) hypertension: Secondary | ICD-10-CM | POA: Insufficient documentation

## 2015-06-14 DIAGNOSIS — Z79899 Other long term (current) drug therapy: Secondary | ICD-10-CM | POA: Diagnosis not present

## 2015-06-14 DIAGNOSIS — R531 Weakness: Secondary | ICD-10-CM | POA: Diagnosis present

## 2015-06-14 LAB — CBC
HEMATOCRIT: 39.6 % — AB (ref 40.0–52.0)
Hemoglobin: 12.9 g/dL — ABNORMAL LOW (ref 13.0–18.0)
MCH: 25.2 pg — ABNORMAL LOW (ref 26.0–34.0)
MCHC: 32.5 g/dL (ref 32.0–36.0)
MCV: 77.6 fL — AB (ref 80.0–100.0)
PLATELETS: 268 10*3/uL (ref 150–440)
RBC: 5.1 MIL/uL (ref 4.40–5.90)
RDW: 18.9 % — AB (ref 11.5–14.5)
WBC: 8.3 10*3/uL (ref 3.8–10.6)

## 2015-06-14 LAB — BASIC METABOLIC PANEL
Anion gap: 15 (ref 5–15)
BUN: <5 mg/dL — ABNORMAL LOW (ref 6–20)
CO2: 30 mmol/L (ref 22–32)
CREATININE: 0.71 mg/dL (ref 0.61–1.24)
Calcium: 9.3 mg/dL (ref 8.9–10.3)
Chloride: 87 mmol/L — ABNORMAL LOW (ref 101–111)
GFR calc Af Amer: >60 mL/min (ref >60)
GFR calc non Af Amer: >60 mL/min (ref >60)
Glucose, Bld: 189 mg/dL — ABNORMAL HIGH (ref 65–99)
Potassium: 2.6 mmol/L — CL (ref 3.5–5.1)
Sodium: 132 mmol/L — ABNORMAL LOW (ref 135–145)

## 2015-06-14 LAB — HEPATIC FUNCTION PANEL
ALBUMIN: 4.2 g/dL (ref 3.5–5.0)
ALT: 86 U/L — AB (ref 17–63)
AST: 150 U/L — ABNORMAL HIGH (ref 15–41)
Alkaline Phosphatase: 173 U/L — ABNORMAL HIGH (ref 38–126)
Bilirubin, Direct: 0.2 mg/dL (ref 0.1–0.5)
Indirect Bilirubin: 0.5 mg/dL (ref 0.3–0.9)
Total Bilirubin: 0.7 mg/dL (ref 0.3–1.2)
Total Protein: 8.2 g/dL — ABNORMAL HIGH (ref 6.5–8.1)

## 2015-06-14 LAB — CK: CK TOTAL: 255 U/L (ref 49–397)

## 2015-06-14 LAB — TROPONIN I

## 2015-06-14 LAB — MAGNESIUM: Magnesium: 1.6 mg/dL — ABNORMAL LOW (ref 1.7–2.4)

## 2015-06-14 MED ORDER — MAGNESIUM SULFATE 2 GM/50ML IV SOLN
2.0000 g | Freq: Once | INTRAVENOUS | Status: AC
  Administered 2015-06-14: 2 g via INTRAVENOUS

## 2015-06-14 MED ORDER — POTASSIUM CHLORIDE CRYS ER 20 MEQ PO TBCR
40.0000 meq | EXTENDED_RELEASE_TABLET | Freq: Once | ORAL | Status: AC
  Administered 2015-06-14: 40 meq via ORAL

## 2015-06-14 MED ORDER — MAGNESIUM SULFATE 2 GM/50ML IV SOLN
INTRAVENOUS | Status: AC
  Administered 2015-06-14: 2 g via INTRAVENOUS
  Filled 2015-06-14: qty 50

## 2015-06-14 MED ORDER — POTASSIUM CHLORIDE CRYS ER 20 MEQ PO TBCR
EXTENDED_RELEASE_TABLET | ORAL | Status: AC
  Administered 2015-06-14: 40 meq via ORAL
  Filled 2015-06-14: qty 2

## 2015-06-14 NOTE — ED Notes (Signed)
Yellow fall risk band placed at triage

## 2015-06-14 NOTE — ED Notes (Signed)
Pt reports his MD called him last night and told him his K is low.  Reports generalized weakness

## 2015-06-14 NOTE — ED Provider Notes (Signed)
[facility] Emergency Department Provider Note  ____________________________________________  Time seen: Approximately 12:52 PM  I have reviewed the triage vital signs and the nursing notes.   HISTORY  Chief Complaint Weakness    HPI Seth [name] is a 44 y.o. male came to the ER yesterday for seizure. He couldn't wait for any testing be done yesterday. Today comes back saying his doctor called him and told him to come to the ER because of potassium is very low. Patient complains of continued muscle aches and pains since to seizure yesterday. These muscle aches and pains are diffuse. He has had them before after seizures. He has no fevers chest pain shortness of breath or any other complaints.   Past Medical History  Diagnosis Date  . Hypertension   . Seizures   . Cirrhosis   . Chronic back pain     Patient Active Problem List   Diagnosis Date Noted  . 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 Seizure disorder 02/04/2013  . Hypertension 02/04/2013  . Hyponatremia 02/04/2013  . Hypokalemia 02/04/2013    History reviewed. No pertinent past surgical history.  Current Outpatient Rx  Name  Route  Sig  Dispense  Refill  . amLODipine (NORVASC) 10 MG tablet   Oral   Take 10 mg by mouth daily.         . carvedilol (COREG) 6.25 MG tablet   Oral   Take 6.25 mg by mouth 2 (two) times daily.         . chlorthalidone (HYGROTON) 25 MG tablet   Oral   Take 25 mg by mouth daily.         . diazepam (VALIUM) 5 MG tablet   Oral   Take 5 mg by mouth 3 (three) times daily.         Marland Kitchen. gabapentin (NEURONTIN) 400 MG capsule   Oral   Take 400 mg by mouth 3 (three) times daily.         Marland Kitchen. lacosamide (VIMPAT) 200 MG TABS tablet   Oral   Take 200 mg by mouth 2 (two) times daily.         . mirtazapine (REMERON) 30 MG tablet   Oral   Take 30 mg by mouth at bedtime.         Marland Kitchen. omeprazole (PRILOSEC) 20 MG capsule   Oral   Take 20 mg  by mouth 2 (two) times daily.         . sucralfate (CARAFATE) 1 G tablet   Oral   Take 1 g by mouth 4 (four) times daily -  with meals and at bedtime.         . levETIRAcetam (KEPPRA) 1000 MG tablet   Oral   Take 1 tablet (1,000 mg total) by mouth 2 (two) times daily. Patient not taking: Reported on 06/14/2015   60 tablet   1   . oxyCODONE-acetaminophen (PERCOCET/ROXICET) 5-325 MG per tablet   Oral   Take 1-2 tablets by mouth every 4 (four) hours as needed. Patient not taking: Reported on 06/14/2015   100 tablet   0   . potassium chloride SA (K-DUR,KLOR-CON) 20 MEQ tablet   Oral   Take 1 tablet (20 mEq total) by mouth 3 (three) times daily. Patient not taking: Reported on 06/14/2015   60 tablet   0     Allergies Review of patient's allergies indicates no known allergies.  History reviewed. No pertinent family history.  Social  History History  Substance Use Topics  . Smoking status: Current Every Day Smoker -- 0.25 packs/day for 25 years    Types: Cigarettes  . Smokeless tobacco: Not on file  . Alcohol Use: 4.8 oz/week    8 Cans of beer per week    Review of Systems Constitutional: No fever/chills Eyes: No visual changes. ENT: No sore throat. Cardiovascular: Denies chest pain. Respiratory: Denies shortness of breath. Gastrointestinal: No abdominal pain.  No nausea, no vomiting.  No diarrhea.  No constipation. Genitourinary: Negative for dysuria. Musculoskeletal: Negative for back pain. Skin: Negative for rash. Neurological: Negative for headaches, focal weakness or numbness.  10-point ROS otherwise negative.  ____________________________________________   PHYSICAL EXAM:  VITAL SIGNS: ED Triage Vitals  Enc Vitals Group     BP 06/14/15 1130 152/82 mmHg     Pulse Rate 06/14/15 1130 85     Resp 06/14/15 1130 18     Temp 06/14/15 1130 98.4 F (36.9 C)     Temp Source 06/14/15 1130 Oral     SpO2 06/14/15 1130 97 %     Weight 06/14/15 1130 167 lb (75.751  kg)     Height 06/14/15 1130 5\' 6"  (1.676 m)     Head Cir --      Peak Flow --      Pain Score 06/14/15 1131 1     Pain Loc --      Pain Edu? --      Excl. in GC? --     Constitutional: Alert and oriented. Well appearing and in no acute distress. Eyes: Conjunctivae are normal. PERRL. EOMI. Head: Atraumatic. Nose: No congestion/rhinnorhea. Mouth/Throat: Mucous membranes are moist.  Oropharynx non-erythematous. Neck: No stridor.  Cardiovascular: Normal rate, regular rhythm. Grossly normal heart sounds.  Good peripheral circulation. Respiratory: Normal respiratory effort.  No retractions. Lungs CTAB. Gastrointestinal: Soft and nontender. No distention. No abdominal bruits. No CVA tenderness. Musculoskeletal: No lower extremity tenderness nor edema.  No joint effusions. Neurologic:  Normal speech and language. No gross focal neurologic deficits are appreciated her strength is 5 over 5 throughout cranial nerves II through XII are intact is intact throughout cerebellar finger-nose is normal. Speech is normal. No gait instability. Skin:  Skin is warm, dry and intact. No rash noted. Psychiatric: Mood and affect are normal. Speech and behavior are normal.  ____________________________________________   LABS (all labs ordered are listed, but only abnormal results are displayed)  Labs Reviewed  CBC - Abnormal; Notable for the following:    Hemoglobin 12.9 (*)    HCT 39.6 (*)    MCV 77.6 (*)    MCH 25.2 (*)    RDW 18.9 (*)    All other components within normal limits  BASIC METABOLIC PANEL - Abnormal; Notable for the following:    Sodium 132 (*)    Potassium 2.6 (*)    Chloride 87 (*)    Glucose, Bld 189 (*)    BUN <5 (*)    All other components within normal limits  HEPATIC FUNCTION PANEL - Abnormal; Notable for the following:    Total Protein 8.2 (*)    AST 150 (*)    ALT 86 (*)    Alkaline Phosphatase 173 (*)    All other components within normal limits  MAGNESIUM - Abnormal;  Notable for the following:    Magnesium 1.6 (*)    All other components within normal limits  TROPONIN I  CK   ____________________________________________  EKG  EKG read  and interpreted by me normal sinus rhythm rate of 81 normal axis also looked normal as a normal EKG ____________________________________________  RADIOLOGY   ____________________________________________   PROCEDURES    ____________________________________________   INITIAL IMPRESSION / ASSESSMENT AND PLAN / ED COURSE  Pertinent labs & imaging results that were available during my care of the patient were reviewed by me and considered in my medical decision making (see chart for details).  She returns to 0.6 give him 40 of potassium now on 40 more on discharge with rest of his labs have returned. We'll advise him to go home and drink a can of the low sodium the 8 which has over thousand milligrams of potassium in it. Labs returned his AST is somewhat elevated patient does drink alcohol 8 cans of beer a day at least as probable explanation for partner sees him and is administering Vimpat that he is aware of the alcoholism. ____________________________________________   FINAL CLINICAL IMPRESSION(S) / ED DIAGNOSES  Final diagnoses:  Hypokalemia      Arnaldo Natal, MD 06/14/15 1419

## 2015-06-14 NOTE — Discharge Instructions (Signed)
Potassium Content of Foods Potassium is a mineral found in many foods and drinks. It helps keep fluids and minerals balanced in your body and affects how steadily your heart beats. Potassium also helps control your blood pressure and keep your muscles and nervous system healthy. Certain health conditions and medicines may change the balance of potassium in your body. When this happens, you can help balance your level of potassium through the foods that you do or do not eat. Your health care provider or dietitian may recommend an amount of potassium that you should have each day. The following lists of foods provide the amount of potassium (in parentheses) per serving in each item. HIGH IN POTASSIUM  The following foods and beverages have 200 mg or more of potassium per serving:  Apricots, 2 raw or 5 dry (200 mg).  Artichoke, 1 medium (345 mg).  Avocado, raw,  each (245 mg).  Banana, 1 medium (425 mg).  Beans, lima, or baked beans, canned,  cup (280 mg).  Beans, white, canned,  cup (595 mg).  Beef roast, 3 oz (320 mg).  Beef, ground, 3 oz (270 mg).  Beets, raw or cooked,  cup (260 mg).  Bran muffin, 2 oz (300 mg).  Broccoli,  cup (230 mg).  Brussels sprouts,  cup (250 mg).  Cantaloupe,  cup (215 mg).  Cereal, 100% bran,  cup (200-400 mg).  Cheeseburger, single, fast food, 1 each (225-400 mg).  Chicken, 3 oz (220 mg).  Clams, canned, 3 oz (535 mg).  Crab, 3 oz (225 mg).  Dates, 5 each (270 mg).  Dried beans and peas,  cup (300-475 mg).  Figs, dried, 2 each (260 mg).  Fish: halibut, tuna, cod, snapper, 3 oz (480 mg).  Fish: salmon, haddock, swordfish, perch, 3 oz (300 mg).  Fish, tuna, canned 3 oz (200 mg).  [place] fries, fast food, 3 oz (470 mg).  Granola with fruit and nuts,  cup (200 mg).  Grapefruit juice,  cup (200 mg).  Greens, beet,  cup (655 mg).  Honeydew melon,  cup (200 mg).  Kale, raw, 1 cup (300 mg).  Kiwi, 1 medium (240  mg).  Kohlrabi, rutabaga, parsnips,  cup (280 mg).  Lentils,  cup (365 mg).  Mango, 1 each (325 mg).  Milk, chocolate, 1 cup (420 mg).  Milk: nonfat, low-fat, whole, buttermilk, 1 cup (350-380 mg).  Molasses, 1 Tbsp (295 mg).  Mushrooms,  cup (280) mg.  Nectarine, 1 each (275 mg).  Nuts: almonds, peanuts, hazelnuts, [place], cashew, mixed, 1 oz (200 mg).  Nuts, pistachios, 1 oz (295 mg).  Orange, 1 each (240 mg).  Orange juice,  cup (235 mg).  Papaya, medium,  fruit (390 mg).  Peanut butter, chunky, 2 Tbsp (240 mg).  Peanut butter, smooth, 2 Tbsp (210 mg).  Pear, 1 medium (200 mg).  Pomegranate, 1 whole (400 mg).  Pomegranate juice,  cup (215 mg).  Pork, 3 oz (350 mg).  Potato chips, salted, 1 oz (465 mg).  Potato, baked with skin, 1 medium (925 mg).  Potatoes, boiled,  cup (255 mg).  Potatoes, mashed,  cup (330 mg).  Prune juice,  cup (370 mg).  Prunes, 5 each (305 mg).  Pudding, chocolate,  cup (230 mg).  Pumpkin, canned,  cup (250 mg).  Raisins, seedless,  cup (270 mg).  Seeds, sunflower or pumpkin, 1 oz (240 mg).  Soy milk, 1 cup (300 mg).  Spinach,  cup (420 mg).  Spinach, canned,  cup (370 mg).  Sweet potato, baked with skin, 1 medium (450 mg).  Swiss chard,  cup (480 mg).  Tomato or vegetable juice,  cup (275 mg).  Tomato sauce or puree,  cup (400-550 mg).  Tomato, raw, 1 medium (290 mg).  Tomatoes, canned,  cup (200-300 mg).  [place], 3 oz (250 mg).  Wheat germ, 1 oz (250 mg).  Winter squash,  cup (250 mg).  Yogurt, plain or fruited, 6 oz (260-435 mg).  Zucchini,  cup (220 mg). MODERATE IN POTASSIUM The following foods and beverages have 50-200 mg of potassium per serving:  Apple, 1 each (150 mg).  Apple juice,  cup (150 mg).  Applesauce,  cup (90 mg).  Apricot nectar,  cup (140 mg).  Asparagus, small spears,  cup or 6 spears (155 mg).  Bagel, cinnamon raisin, 1 each (130 mg).  Bagel,  egg or plain, 4 in., 1 each (70 mg).  Beans, green,  cup (90 mg).  Beans, yellow,  cup (190 mg).  Beer, regular, 12 oz (100 mg).  Beets, canned,  cup (125 mg).  Blackberries,  cup (115 mg).  Blueberries,  cup (60 mg).  Bread, whole wheat, 1 slice (70 mg).  Broccoli, raw,  cup (145 mg).  Cabbage,  cup (150 mg).  Carrots, cooked or raw,  cup (180 mg).  Cauliflower, raw,  cup (150 mg).  Celery, raw,  cup (155 mg).  Cereal, bran flakes, cup (120-150 mg).  Cheese, cottage,  cup (110 mg).  Cherries, 10 each (150 mg).  Chocolate, 1 oz bar (165 mg).  Coffee, brewed 6 oz (90 mg).  Corn,  cup or 1 ear (195 mg).  Cucumbers,  cup (80 mg).  Egg, large, 1 each (60 mg).  Eggplant,  cup (60 mg).  Endive, raw, cup (80 mg).  English muffin, 1 each (65 mg).  Fish, orange roughy, 3 oz (150 mg).  Frankfurter, beef or pork, 1 each (75 mg).  Fruit cocktail,  cup (115 mg).  Grape juice,  cup (170 mg).  Grapefruit,  fruit (175 mg).  Grapes,  cup (155 mg).  Greens: kale, turnip, collard,  cup (110-150 mg).  Ice cream or frozen yogurt, chocolate,  cup (175 mg).  Ice cream or frozen yogurt, vanilla,  cup (120-150 mg).  Lemons, limes, 1 each (80 mg).  Lettuce, all types, 1 cup (100 mg).  Mixed vegetables,  cup (150 mg).  Mushrooms, raw,  cup (110 mg).  Nuts: walnuts, pecans, or macadamia, 1 oz (125 mg).  Oatmeal,  cup (80 mg).  Okra,  cup (110 mg).  Onions, raw,  cup (120 mg).  Peach, 1 each (185 mg).  Peaches, canned,  cup (120 mg).  Pears, canned,  cup (120 mg).  Peas, green, frozen,  cup (90 mg).  Peppers, green,  cup (130 mg).  Peppers, red,  cup (160 mg).  Pineapple juice,  cup (165 mg).  Pineapple, fresh or canned,  cup (100 mg).  Plums, 1 each (105 mg).  Pudding, vanilla,  cup (150 mg).  Raspberries,  cup (90 mg).  Rhubarb,  cup (115 mg).  Rice, wild,  cup (80 mg).  Shrimp, 3 oz (155  mg).  Spinach, raw, 1 cup (170 mg).  Strawberries,  cup (125 mg).  Summer squash  cup (175-200 mg).  Swiss chard, raw, 1 cup (135 mg).  Tangerines, 1 each (140 mg).  Tea, brewed, 6 oz (65 mg).  Turnips,  cup (140 mg).  Watermelon,  cup (85 mg).  Wine, red, table,  5 oz (180 mg).  Wine, white, table, 5 oz (100 mg). LOW IN POTASSIUM The following foods and beverages have less than 50 mg of potassium per serving.  Bread, white, 1 slice (30 mg).  Carbonated beverages, 12 oz (less than 5 mg).  Cheese, 1 oz (20-30 mg).  Cranberries,  cup (45 mg).  Cranberry juice cocktail,  cup (20 mg).  Fats and oils, 1 Tbsp (less than 5 mg).  Hummus, 1 Tbsp (32 mg).  Nectar: papaya, mango, or pear,  cup (35 mg).  Rice, white or brown,  cup (50 mg).  Spaghetti or macaroni,  cup cooked (30 mg).  Tortilla, flour or corn, 1 each (50 mg).  Waffle, 4 in., 1 each (50 mg).  Water chestnuts,  cup (40 mg). Document Released: 07/14/2005 Document Revised: 12/05/2013 Document Reviewed: 10/27/2013 [facility] Patient Information 2015 [place], [place]. This information is not intended to replace advice given to you by your health care provider. Make sure you discuss any questions you have with your health care provider.  Hypokalemia Hypokalemia means that the amount of potassium in the blood is lower than normal.Potassium is a chemical, called an electrolyte, that helps regulate the amount of fluid in the body. It also stimulates muscle contraction and helps nerves function properly.Most of the body's potassium is inside of cells, and only a very small amount is in the blood. Because the amount in the blood is so small, minor changes can be life-threatening. CAUSES  Antibiotics.  Diarrhea or vomiting.  Using laxatives too much, which can cause diarrhea.  Chronic kidney disease.  Water pills (diuretics).  Eating disorders (bulimia).  Low magnesium level.  Sweating a  lot. SIGNS AND SYMPTOMS  Weakness.  Constipation.  Fatigue.  Muscle cramps.  Mental confusion.  Skipped heartbeats or irregular heartbeat (palpitations).  Tingling or numbness. DIAGNOSIS  Your health care provider can diagnose hypokalemia with blood tests. In addition to checking your potassium level, your health care provider may also check other lab tests. TREATMENT Hypokalemia can be treated with potassium supplements taken by mouth or adjustments in your current medicines. If your potassium level is very low, you may need to get potassium through a vein (IV) and be monitored in the hospital. A diet high in potassium is also helpful. Foods high in potassium are:  Nuts, such as peanuts and pistachios.  Seeds, such as sunflower seeds and pumpkin seeds.  Peas, lentils, and lima beans.  Whole grain and bran cereals and breads.  Fresh fruit and vegetables, such as apricots, avocado, bananas, cantaloupe, kiwi, oranges, tomatoes, asparagus, and potatoes.  Orange and tomato juices.  Red meats.  Fruit yogurt. HOME CARE INSTRUCTIONS  Take all medicines as prescribed by your health care provider.  Maintain a healthy diet by including nutritious food, such as fruits, vegetables, nuts, whole grains, and lean meats.  If you are taking a laxative, be sure to follow the directions on the label. SEEK MEDICAL CARE IF:  Your weakness gets worse.  You feel your heart pounding or racing.  You are vomiting or having diarrhea.  You are diabetic and having trouble keeping your blood glucose in the normal range. SEEK IMMEDIATE MEDICAL CARE IF:  You have chest pain, shortness of breath, or dizziness.  You are vomiting or having diarrhea for more than 2 days.  You faint. MAKE SURE YOU:   Understand these instructions.  Will watch your condition.  Will get help right away if you are not doing well or get worse.  Document Released: 11/30/2005 Document Revised: 09/20/2013  Document Reviewed: 06/02/2013 [facility] Patient Information 2015 [place]. This information is not intended to replace advice given to you by your health care provider. Make sure you discuss any questions you have with your health care provider.  Please follow up with your doctor this coming week. Make sure Dr Malvin JohnsPotter sees your labs  From today -your liver functions are elevated. Please cut back on your alcohol. Hypomagnesemia Magnesium is a common ion (mineral) in the body which is needed for metabolism. It is about how the body handles food and other chemical reactions necessary for life. Only about 2% of the magnesium in our body is found in the blood. When this is low, it is called hypomagnesemia. The blood will measure only a tiny amount of the magnesium in our body. When it is low in our blood, it does not mean that the whole body supply is low. The normal serum concentration ranges from 1.8-2.5 mEq/L. When the level gets to be less than 1.0 mEq/L, a number of problems begin to happen.  CAUSES   Receiving intravenous fluids without magnesium replacement.  Loss of magnesium from the bowel by nasogastric suction.  Loss of magnesium from nausea and vomiting or severe diarrhea. Any of the inflammatory bowel conditions can cause this.  Abuse of alcohol often leads to low serum magnesium.  An inherited form of magnesium loss happens when the kidneys lose magnesium. This is called familial or primary hypomagnesemia.  Some medications such as diuretics also cause the loss of magnesium. SYMPTOMS  These following problems are worse if the changes in magnesium levels come on suddenly.  Tremor.  Confusion.  Muscle weakness.  Oversensitive to sights and sounds.  Sensitive reflexes.  Depression.  Muscular fibrillations.  Overreactivity of the nerves.  Irritability.  Psychosis.  Spasms of the hand muscles.  Tetany (where the muscles go into uncontrollable spasms). DIAGNOSIS   This condition can be diagnosed by blood tests. TREATMENT   In an emergency, magnesium can be given intravenously (by vein).  If the condition is less worrisome, it can be corrected by diet. High levels of magnesium are found in green leafy vegetables, peas, beans, and nuts among other things. It can also be given through medications by mouth.  If it is being caused by medications, changes can be made.  If alcohol is a problem, help is available if there are difficulties giving it up. Document Released: 08/26/2005 Document Revised: 04/16/2014 Document Reviewed: 07/20/2008 [facility] Patient Information 2015 [place], [place]. This information is not intended to replace advice given to you by your health care provider. Make sure you discuss any questions you have with your health care provider.  You may want to drink one of the low sodium V-8's once a week. There is a lot of potassium in that.

## 2015-07-03 ENCOUNTER — Emergency Department
Admission: EM | Admit: 2015-07-03 | Discharge: 2015-07-03 | Disposition: A | Discharge status: Home / Self Care | Payer: Medicaid Other | Attending: Emergency Medicine | Admitting: Emergency Medicine

## 2015-07-03 ENCOUNTER — Emergency Department: Payer: Medicaid Other

## 2015-07-03 DIAGNOSIS — S51812A Laceration without foreign body of left forearm, initial encounter: Secondary | ICD-10-CM | POA: Diagnosis not present

## 2015-07-03 DIAGNOSIS — IMO0002 Reserved for concepts with insufficient information to code with codable children: Secondary | ICD-10-CM

## 2015-07-03 DIAGNOSIS — Y9389 Activity, other specified: Secondary | ICD-10-CM | POA: Diagnosis not present

## 2015-07-03 DIAGNOSIS — Z79899 Other long term (current) drug therapy: Secondary | ICD-10-CM | POA: Insufficient documentation

## 2015-07-03 DIAGNOSIS — Z720 Tobacco use: Secondary | ICD-10-CM | POA: Diagnosis not present

## 2015-07-03 DIAGNOSIS — K7030 Alcoholic cirrhosis of liver without ascites: Secondary | ICD-10-CM | POA: Insufficient documentation

## 2015-07-03 DIAGNOSIS — S01511A Laceration without foreign body of lip, initial encounter: Secondary | ICD-10-CM | POA: Insufficient documentation

## 2015-07-03 DIAGNOSIS — S01112A Laceration without foreign body of left eyelid and periocular area, initial encounter: Secondary | ICD-10-CM | POA: Diagnosis not present

## 2015-07-03 DIAGNOSIS — W1839XA Other fall on same level, initial encounter: Secondary | ICD-10-CM | POA: Diagnosis not present

## 2015-07-03 DIAGNOSIS — I10 Essential (primary) hypertension: Secondary | ICD-10-CM | POA: Insufficient documentation

## 2015-07-03 DIAGNOSIS — Y92049 Unspecified place in boarding-house as the place of occurrence of the external cause: Secondary | ICD-10-CM | POA: Insufficient documentation

## 2015-07-03 DIAGNOSIS — Y998 Other external cause status: Secondary | ICD-10-CM | POA: Insufficient documentation

## 2015-07-03 DIAGNOSIS — F10920 Alcohol use, unspecified with intoxication, uncomplicated: Secondary | ICD-10-CM

## 2015-07-03 DIAGNOSIS — F10120 Alcohol abuse with intoxication, uncomplicated: Secondary | ICD-10-CM | POA: Diagnosis not present

## 2015-07-03 DIAGNOSIS — S0990XA Unspecified injury of head, initial encounter: Secondary | ICD-10-CM | POA: Diagnosis present

## 2015-07-03 MED ORDER — IOHEXOL 350 MG/ML SOLN
100.0000 mL | Freq: Once | INTRAVENOUS | Status: AC | PRN
  Administered 2015-07-03: 100 mL via INTRAVENOUS

## 2015-07-03 MED ORDER — LIDOCAINE-EPINEPHRINE 1 %-1:100000 IJ SOLN: 10.0000 mL | Freq: Once | INTRAMUSCULAR | Status: DC

## 2015-07-03 MED ORDER — LIDOCAINE-EPINEPHRINE (PF) 1 %-1:200000 IJ SOLN
INTRAMUSCULAR | Status: AC
  Administered 2015-07-03: 22:00:00
  Filled 2015-07-03: qty 30

## 2015-07-03 NOTE — ED Provider Notes (Signed)
[facility] Emergency Department Provider Note   ____________________________________________  Time seen: On EMS arrival  I have reviewed the triage vital signs and the nursing notes.   HISTORY  Chief Complaint Fall and Alcohol Intoxication   History limited by: Intoxication   HPI Seth [name] is a 44 y.o. male patient presents to the emergency department today via EMS after a fall. Unfortunately patient appears intoxicated and is unable to provide any significant history.EMS does state that he was found at the bottom of the steps at his boarding house. He did have a broken bottle of alcohol by him. Multiple lacerations from this class. Boarding house friend states that they have been drinking alcohol today. Patient himself unable to give any history.    Past Medical History  Diagnosis Date  . Hypertension   . Seizures   . Cirrhosis   . Chronic back pain     Patient Active Problem List   Diagnosis Date Noted  . 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 Seizure disorder 02/04/2013  . Hypertension 02/04/2013  . Hyponatremia 02/04/2013  . Hypokalemia 02/04/2013    No past surgical history on file.  Current Outpatient Rx  Name  Route  Sig  Dispense  Refill  . amLODipine (NORVASC) 10 MG tablet   Oral   Take 10 mg by mouth daily.         . carvedilol (COREG) 6.25 MG tablet   Oral   Take 6.25 mg by mouth 2 (two) times daily.         . chlorthalidone (HYGROTON) 25 MG tablet   Oral   Take 25 mg by mouth daily.         . diazepam (VALIUM) 5 MG tablet   Oral   Take 5 mg by mouth 3 (three) times daily.         Marland Kitchen. gabapentin (NEURONTIN) 400 MG capsule   Oral   Take 400 mg by mouth 3 (three) times daily.         Marland Kitchen. lacosamide (VIMPAT) 200 MG TABS tablet   Oral   Take 200 mg by mouth 2 (two) times daily.         Marland Kitchen. levETIRAcetam (KEPPRA) 1000 MG tablet   Oral   Take 1 tablet (1,000 mg total) by mouth 2 (two) times  daily. Patient not taking: Reported on 06/14/2015   60 tablet   1   . mirtazapine (REMERON) 30 MG tablet   Oral   Take 30 mg by mouth at bedtime.         Marland Kitchen. omeprazole (PRILOSEC) 20 MG capsule   Oral   Take 20 mg by mouth 2 (two) times daily.         Marland Kitchen. oxyCODONE-acetaminophen (PERCOCET/ROXICET) 5-325 MG per tablet   Oral   Take 1-2 tablets by mouth every 4 (four) hours as needed. Patient not taking: Reported on 06/14/2015   100 tablet   0   . potassium chloride SA (K-DUR,KLOR-CON) 20 MEQ tablet   Oral   Take 1 tablet (20 mEq total) by mouth 3 (three) times daily. Patient not taking: Reported on 06/14/2015   60 tablet   0   . sucralfate (CARAFATE) 1 G tablet   Oral   Take 1 g by mouth 4 (four) times daily -  with meals and at bedtime.           Allergies Review of patient's allergies indicates no known allergies.  No family history  on file.  Social History History  Substance Use Topics  . Smoking status: Current Every Day Smoker -- 0.25 packs/day for 25 years    Types: Cigarettes  . Smokeless tobacco: Not on file  . Alcohol Use: 4.8 oz/week    8 Cans of beer per week    Review of Systems Unable to obtain secondary to altered mental status.  ____________________________________________   PHYSICAL EXAM:  VITAL SIGNS: ED Triage Vitals  Enc Vitals Group     BP 07/03/15 2021 132/81 mmHg     Pulse Rate 07/03/15 2021 90     Resp 07/03/15 2021 18     Temp 07/03/15 2021 98.2 F (36.8 C)     Temp Source 07/03/15 2021 Oral     SpO2 07/03/15 2021 95 %     Weight 07/03/15 2021 156 lb 6 oz (70.931 kg)     Height 07/03/15 2021 5\' 7"  (1.702 m)     Head Cir --      Peak Flow --      Pain Score 07/03/15 2022 10   Constitutional: Intoxicated, moving all extremities, multiple lacerations Eyes: Conjunctivae are normal. PERRL. Normal extraocular movements. Some bruising and swelling to the left bottom eyelid.  ENT   Head: Normocephalic. Superficial laceration  over the left eyebrow.   Nose: No congestion/rhinnorhea.   Mouth/Throat: Mucous membranes are moist. Laceration to inside of upper lip   Neck: No stridor. In c-collar Hematological/Lymphatic/Immunilogical: No cervical lymphadenopathy. Cardiovascular: Normal rate, regular rhythm.  No murmurs, rubs, or gallops. Respiratory: Normal respiratory effort without tachypnea nor retractions. Breath sounds are clear and equal bilaterally. No wheezes/rales/rhonchi. No chest wall tenderness or crepitus.  Gastrointestinal: Soft and nontender. No distention.  Genitourinary: Deferred Musculoskeletal: Normal range of motion in all extremities. No joint effusions.  No lower extremity tenderness nor edema. Tender to palpation of thoracic spine. Left forearm with two superficial lacerations, one with a small piece of glass in it (easily removed with foreceps).  Neurologic:  Moving all extremities. Intoxicated Skin:  Skin is warm, dry. Multiple lacerations.   ____________________________________________    LABS (pertinent positives/negatives)  None  ____________________________________________   EKG  None  ____________________________________________    RADIOLOGY  CT head/C-spine/Maxillofacial  IMPRESSION: CT head: Atrophic changes without acute abnormality.  CT of the maxillofacial bones: Changes of prior nasal bone fracture. No acute bony abnormality is noted.  Left periorbital soft tissue swelling without focal hematoma.  CT of the cervical spine: Mild degenerative changes without acute Abnormality.  CT chest/abd/pelvis  IMPRESSION: 1. No evidence of traumatic injury to the chest, abdomen or pelvis. 2. Lungs clear bilaterally. 3. Mild scattered coronary artery calcification noted. 4. Findings of hepatic cirrhosis and underlying likely regenerative nodules. No dominant mass seen, though evaluation for mass is limited on this phase of contrast enhancement. 5. Small right  renal cyst noted. 6. Mild scattered calcification along the abdominal aorta and its branches. 7. Scattered diverticulosis along the descending and proximal sigmoid colon, without evidence of diverticulitis. 8. Prominent Schmorl's nodes again noted at the superior endplates of L3 and L4.   ____________________________________________   PROCEDURES  Procedure(s) performed: None  Critical Care performed: No  ____________________________________________   INITIAL IMPRESSION / ASSESSMENT AND PLAN / ED COURSE  Pertinent labs & imaging results that were available during my care of the patient were reviewed by me and considered in my medical decision making (see chart for details).  Patient presents to the emergency department intoxicated and after a fall.  Patient with multiple superficial lacerations not requiring advanced closure. Given the patient is unable to give a good exam will get CT head spine maxillofacial chest and stomach.  Patient CT scans without any concerning findings. I did have a discussion with the patient about the finding of cirrhosis. Patient states he is aware of this. I highly advised and encouraged patient to stop his alcohol use.  ____________________________________________   FINAL CLINICAL IMPRESSION(S) / ED DIAGNOSES  Final diagnoses:  Alcohol intoxication, uncomplicated  Laceration  Alcoholic cirrhosis of liver without ascites     Phineas Semen, MD 07/03/15 [phone]

## 2015-07-03 NOTE — Discharge Instructions (Signed)
Please seek medical attention for any high fevers, chest pain, shortness of breath, change in behavior, persistent vomiting, bloody stool or any other new or concerning symptoms. ° °Alcohol Use Disorder °Alcohol use disorder is a mental disorder. It is not a one-time incident of heavy drinking. Alcohol use disorder is the excessive and uncontrollable use of alcohol over time that leads to problems with functioning in one or more areas of daily living. People with this disorder risk harming themselves and others when they drink to excess. Alcohol use disorder also can cause other mental disorders, such as mood and anxiety disorders, and serious physical problems. People with alcohol use disorder often misuse other drugs.  °Alcohol use disorder is common and widespread. Some people with this disorder drink alcohol to cope with or escape from negative life events. Others drink to relieve chronic pain or symptoms of mental illness. People with a family history of alcohol use disorder are at higher risk of losing control and using alcohol to excess.  °SYMPTOMS  °Signs and symptoms of alcohol use disorder may include the following:  °· Consumption of alcohol in larger amounts or over a longer period of time than intended. °· Multiple unsuccessful attempts to cut down or control alcohol use.   °· A great deal of time spent obtaining alcohol, using alcohol, or recovering from the effects of alcohol (hangover). °· A strong desire or urge to use alcohol (cravings).   °· Continued use of alcohol despite problems at work, school, or home because of alcohol use.   °· Continued use of alcohol despite problems in relationships because of alcohol use. °· Continued use of alcohol in situations when it is physically hazardous, such as driving a car. °· Continued use of alcohol despite awareness of a physical or psychological problem that is likely related to alcohol use. Physical problems related to alcohol use can involve the brain,  heart, liver, stomach, and intestines. Psychological problems related to alcohol use include intoxication, depression, anxiety, psychosis, delirium, and dementia.   °· The need for increased amounts of alcohol to achieve the same desired effect, or a decreased effect from the consumption of the same amount of alcohol (tolerance). °· Withdrawal symptoms upon reducing or stopping alcohol use, or alcohol use to reduce or avoid withdrawal symptoms. Withdrawal symptoms include: °¨ Racing heart. °¨ Hand tremor. °¨ Difficulty sleeping. °¨ Nausea. °¨ Vomiting. °¨ Hallucinations. °¨ Restlessness. °¨ Seizures. °DIAGNOSIS °Alcohol use disorder is diagnosed through an assessment by your health care provider. Your health care provider may start by asking three or four questions to screen for excessive or problematic alcohol use. To confirm a diagnosis of alcohol use disorder, at least two symptoms must be present within a 12-month period. The severity of alcohol use disorder depends on the number of symptoms: °· Mild--two or three. °· Moderate--four or five. °· Severe--six or more. °Your health care provider may perform a physical exam or use results from lab tests to see if you have physical problems resulting from alcohol use. Your health care provider may refer you to a mental health professional for evaluation. °TREATMENT  °Some people with alcohol use disorder are able to reduce their alcohol use to low-risk levels. Some people with alcohol use disorder need to quit drinking alcohol. When necessary, mental health professionals with specialized training in substance use treatment can help. Your health care provider can help you decide how severe your alcohol use disorder is and what type of treatment you need. The following forms of treatment are available:  °· Detoxification.   Detoxification involves the use of prescription medicines to prevent alcohol withdrawal symptoms in the first week after quitting. This is important  for people with a history of symptoms of withdrawal and for heavy drinkers who are likely to have withdrawal symptoms. Alcohol withdrawal can be dangerous and, in severe cases, cause death. Detoxification is usually provided in a hospital or in-patient substance use treatment facility.  Counseling or talk therapy. Talk therapy is provided by substance use treatment counselors. It addresses the reasons people use alcohol and ways to keep them from drinking again. The goals of talk therapy are to help people with alcohol use disorder find healthy activities and ways to cope with life stress, to identify and avoid triggers for alcohol use, and to handle cravings, which can cause relapse.  Medicines.Different medicines can help treat alcohol use disorder through the following actions:  Decrease alcohol cravings.  Decrease the positive reward response felt from alcohol use.  Produce an uncomfortable physical reaction when alcohol is used (aversion therapy).  Support groups. Support groups are run by people who have quit drinking. They provide emotional support, advice, and guidance. These forms of treatment are often combined. Some people with alcohol use disorder benefit from intensive combination treatment provided by specialized substance use treatment centers. Both inpatient and outpatient treatment programs are available. Document Released: 01/07/2005 Document Revised: 04/16/2014 Document Reviewed: 03/09/2013 [facility] Patient Information 2015 [place], [place]. This information is not intended to replace advice given to you by your health care provider. Make sure you discuss any questions you have with your health care provider.

## 2015-07-03 NOTE — ED Notes (Signed)
Brought to ed via ems from home for evaluation of [facility] house. Complaining of back and facial pain. Received asleep easily awaken, responsive to verbal stimuli. Awaiting further evaluation.

## 2015-07-03 NOTE — ED Notes (Signed)
Pt hair being washed with warm sani-cloths.

## 2015-07-25 ENCOUNTER — Other Ambulatory Visit: Payer: Self-pay

## 2015-07-25 ENCOUNTER — Emergency Department: Payer: Medicaid Other

## 2015-07-25 ENCOUNTER — Emergency Department
Admission: EM | Admit: 2015-07-25 | Discharge: 2015-07-25 | Disposition: A | Discharge status: Other Acute Inpatient Hospital | Payer: Medicaid Other | Attending: Emergency Medicine | Admitting: Emergency Medicine

## 2015-07-25 ENCOUNTER — Encounter: Payer: Self-pay | Admitting: Occupational Medicine

## 2015-07-25 DIAGNOSIS — S065XAA Traumatic subdural hemorrhage with loss of consciousness status unknown, initial encounter: Secondary | ICD-10-CM

## 2015-07-25 DIAGNOSIS — I10 Essential (primary) hypertension: Secondary | ICD-10-CM | POA: Insufficient documentation

## 2015-07-25 DIAGNOSIS — R569 Unspecified convulsions: Secondary | ICD-10-CM | POA: Diagnosis present

## 2015-07-25 DIAGNOSIS — W08XXXA Fall from other furniture, initial encounter: Secondary | ICD-10-CM | POA: Insufficient documentation

## 2015-07-25 DIAGNOSIS — W1839XD Other fall on same level, subsequent encounter: Secondary | ICD-10-CM | POA: Diagnosis not present

## 2015-07-25 DIAGNOSIS — Y9289 Other specified places as the place of occurrence of the external cause: Secondary | ICD-10-CM | POA: Diagnosis not present

## 2015-07-25 DIAGNOSIS — Y998 Other external cause status: Secondary | ICD-10-CM | POA: Diagnosis not present

## 2015-07-25 DIAGNOSIS — Z720 Tobacco use: Secondary | ICD-10-CM | POA: Insufficient documentation

## 2015-07-25 DIAGNOSIS — Z79899 Other long term (current) drug therapy: Secondary | ICD-10-CM | POA: Diagnosis not present

## 2015-07-25 DIAGNOSIS — W1839XA Other fall on same level, initial encounter: Secondary | ICD-10-CM | POA: Diagnosis not present

## 2015-07-25 DIAGNOSIS — Y9389 Activity, other specified: Secondary | ICD-10-CM | POA: Insufficient documentation

## 2015-07-25 DIAGNOSIS — S0083XD Contusion of other part of head, subsequent encounter: Secondary | ICD-10-CM | POA: Diagnosis not present

## 2015-07-25 DIAGNOSIS — F1490 Cocaine use, unspecified, uncomplicated: Secondary | ICD-10-CM | POA: Diagnosis not present

## 2015-07-25 DIAGNOSIS — S065X9A Traumatic subdural hemorrhage with loss of consciousness of unspecified duration, initial encounter: Secondary | ICD-10-CM

## 2015-07-25 DIAGNOSIS — G40909 Epilepsy, unspecified, not intractable, without status epilepticus: Secondary | ICD-10-CM | POA: Diagnosis not present

## 2015-07-25 DIAGNOSIS — Z8719 Personal history of other diseases of the digestive system: Secondary | ICD-10-CM | POA: Diagnosis not present

## 2015-07-25 DIAGNOSIS — F1310 Sedative, hypnotic or anxiolytic abuse, uncomplicated: Secondary | ICD-10-CM | POA: Insufficient documentation

## 2015-07-25 DIAGNOSIS — F10220 Alcohol dependence with intoxication, uncomplicated: Secondary | ICD-10-CM | POA: Diagnosis not present

## 2015-07-25 DIAGNOSIS — S065X0A Traumatic subdural hemorrhage without loss of consciousness, initial encounter: Secondary | ICD-10-CM | POA: Insufficient documentation

## 2015-07-25 DIAGNOSIS — F10920 Alcohol use, unspecified with intoxication, uncomplicated: Secondary | ICD-10-CM

## 2015-07-25 LAB — URINE DRUG SCREEN, QUALITATIVE (ARMC ONLY)
Amphetamines, Ur Screen: NOT DETECTED
BENZODIAZEPINE, UR SCRN: POSITIVE — AB
Barbiturates, Ur Screen: NOT DETECTED
Cannabinoid 50 Ng, Ur ~~LOC~~: NOT DETECTED
Cocaine Metabolite,Ur ~~LOC~~: POSITIVE — AB
MDMA (Ecstasy)Ur Screen: NOT DETECTED
Methadone Scn, Ur: NOT DETECTED
OPIATE, UR SCREEN: NOT DETECTED
Phencyclidine (PCP) Ur S: NOT DETECTED
Tricyclic, Ur Screen: NOT DETECTED

## 2015-07-25 LAB — COMPREHENSIVE METABOLIC PANEL
ALT: 56 U/L (ref 17–63)
AST: 109 U/L — ABNORMAL HIGH (ref 15–41)
Albumin: 4 g/dL (ref 3.5–5.0)
Alkaline Phosphatase: 134 U/L — ABNORMAL HIGH (ref 38–126)
Anion gap: 14 (ref 5–15)
BILIRUBIN TOTAL: 0.3 mg/dL (ref 0.3–1.2)
BUN: 5 mg/dL — ABNORMAL LOW (ref 6–20)
CO2: 24 mmol/L (ref 22–32)
CREATININE: 0.68 mg/dL (ref 0.61–1.24)
Calcium: 8.7 mg/dL — ABNORMAL LOW (ref 8.9–10.3)
Chloride: 101 mmol/L (ref 101–111)
GFR calc Af Amer: >60 mL/min (ref >60)
GFR calc non Af Amer: >60 mL/min (ref >60)
Glucose, Bld: 108 mg/dL — ABNORMAL HIGH (ref 65–99)
POTASSIUM: 3.5 mmol/L (ref 3.5–5.1)
Sodium: 139 mmol/L (ref 135–145)
Total Protein: 7.8 g/dL (ref 6.5–8.1)

## 2015-07-25 LAB — CBC WITH DIFFERENTIAL/PLATELET
BASOS PCT: 2 %
Basophils Absolute: 0.1 10*3/uL (ref 0–0.1)
EOS PCT: 3 %
Eosinophils Absolute: 0.1 10*3/uL (ref 0–0.7)
HEMATOCRIT: 34.9 % — AB (ref 40.0–52.0)
Hemoglobin: 11.2 g/dL — ABNORMAL LOW (ref 13.0–18.0)
LYMPHS PCT: 27 %
Lymphs Abs: 1.4 10*3/uL (ref 1.0–3.6)
MCH: 24.8 pg — ABNORMAL LOW (ref 26.0–34.0)
MCHC: 32 g/dL (ref 32.0–36.0)
MCV: 77.3 fL — AB (ref 80.0–100.0)
Monocytes Absolute: 0.4 10*3/uL (ref 0.2–1.0)
Monocytes Relative: 8 %
Neutro Abs: 3.1 10*3/uL (ref 1.4–6.5)
Neutrophils Relative %: 60 %
Platelets: 244 10*3/uL (ref 150–440)
RBC: 4.51 MIL/uL (ref 4.40–5.90)
RDW: 20.1 % — AB (ref 11.5–14.5)
WBC: 5.2 10*3/uL (ref 3.8–10.6)

## 2015-07-25 LAB — ETHANOL: Alcohol, Ethyl (B): 317 mg/dL (ref <5)

## 2015-07-25 LAB — PROTIME-INR
INR: 1.03
PROTHROMBIN TIME: 13.7 s (ref 11.4–15.0)

## 2015-07-25 LAB — ACETAMINOPHEN LEVEL: Acetaminophen (Tylenol), Serum: <10 ug/mL — ABNORMAL LOW (ref 10–30)

## 2015-07-25 LAB — SALICYLATE LEVEL: Salicylate Lvl: <4 mg/dL (ref 2.8–30.0)

## 2015-07-25 LAB — AMMONIA: Ammonia: 44 umol/L — ABNORMAL HIGH (ref 9–35)

## 2015-07-25 MED ORDER — MORPHINE SULFATE 4 MG/ML IJ SOLN
INTRAMUSCULAR | Status: AC
  Administered 2015-07-25: 4 mg via INTRAVENOUS
  Filled 2015-07-25: qty 1

## 2015-07-25 MED ORDER — MORPHINE SULFATE 4 MG/ML IJ SOLN
4.0000 mg | Freq: Once | INTRAMUSCULAR | Status: AC
  Administered 2015-07-25: 4 mg via INTRAVENOUS

## 2015-07-25 MED ORDER — SODIUM CHLORIDE 0.9 % IV BOLUS (SEPSIS)
1000.0000 mL | Freq: Once | INTRAVENOUS | Status: AC
  Administered 2015-07-25: 1000 mL via INTRAVENOUS

## 2015-07-25 MED ORDER — ONDANSETRON HCL 4 MG/2ML IJ SOLN
4.0000 mg | Freq: Once | INTRAMUSCULAR | Status: AC
  Administered 2015-07-25: 4 mg via INTRAVENOUS

## 2015-07-25 MED ORDER — ONDANSETRON HCL 4 MG/2ML IJ SOLN
INTRAMUSCULAR | Status: AC
  Administered 2015-07-25: 4 mg via INTRAVENOUS
  Filled 2015-07-25: qty 2

## 2015-07-25 MED ORDER — LORAZEPAM 2 MG/ML IJ SOLN
1.0000 mg | Freq: Once | INTRAMUSCULAR | Status: AC
  Administered 2015-07-25: 1 mg via INTRAVENOUS

## 2015-07-25 MED ORDER — LORAZEPAM 2 MG/ML IJ SOLN
INTRAMUSCULAR | Status: AC
  Administered 2015-07-25: 1 mg via INTRAVENOUS
  Filled 2015-07-25: qty 1

## 2015-07-25 NOTE — ED Provider Notes (Signed)
[facility] Emergency Department Provider Note  ____________________________________________  Time seen: Approximately 3:49 AM  I have reviewed the triage vital signs and the nursing notes.   HISTORY  Chief Complaint Seizure Headache   HPI Seth [name] is a 44 y.o. male who presents to the ED via EMS for possible seizure and headache.Patient has a history of seizure disorder, currently taking Vimpat. Patient also has a history of alcoholic cirrhosis and traumatic subdural hematoma in 2014. He was recently seen in the emergency department on July 20 s/p fall. CT head was negative at that time. Girlfriend reports patient had 2 seizures 5 nights ago with fall. He did not seek medical treatment at that time. Patient has been experiencing right-sided headaches since and has been taking "a lot" of BC powder. Admits to drinking 6 beers tonight. Per girlfriend, they had been speaking on the phone for approximately an hour. She noted that the patient seemed disoriented and confused. All of a sudden patient dropped the phone. Girlfriend called patient's mother to check on him (they live in the same house) and she found him unresponsive on the couch. She was subsequently able to awaken him. She did not note urinary incontinence.   Past Medical History  Diagnosis Date  . Hypertension   . Seizures   . Cirrhosis   . Chronic back pain     Patient Active Problem List   Diagnosis Date Noted  . 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 Seizure disorder 02/04/2013  . Hypertension 02/04/2013  . Hyponatremia 02/04/2013  . Hypokalemia 02/04/2013    History reviewed. No pertinent past surgical history.  Current Outpatient Rx  Name  Route  Sig  Dispense  Refill  . amLODipine (NORVASC) 10 MG tablet   Oral   Take 10 mg by mouth daily.         . carvedilol (COREG) 6.25 MG tablet   Oral   Take 6.25 mg by mouth 2 (two) times daily.         . chlorthalidone  (HYGROTON) 25 MG tablet   Oral   Take 25 mg by mouth daily.         . diazepam (VALIUM) 5 MG tablet   Oral   Take 5 mg by mouth 3 (three) times daily.         Marland Kitchen gabapentin (NEURONTIN) 400 MG capsule   Oral   Take 400 mg by mouth 3 (three) times daily.         Marland Kitchen lacosamide (VIMPAT) 200 MG TABS tablet   Oral   Take 200 mg by mouth 2 (two) times daily.         Marland Kitchen levETIRAcetam (KEPPRA) 1000 MG tablet   Oral   Take 1 tablet (1,000 mg total) by mouth 2 (two) times daily. Patient not taking: Reported on 06/14/2015   60 tablet   1   . omeprazole (PRILOSEC) 20 MG capsule   Oral   Take 20 mg by mouth 2 (two) times daily.         Marland Kitchen oxyCODONE-acetaminophen (PERCOCET/ROXICET) 5-325 MG per tablet   Oral   Take 1-2 tablets by mouth every 4 (four) hours as needed. Patient not taking: Reported on 06/14/2015   100 tablet   0   . potassium chloride SA (K-DUR,KLOR-CON) 20 MEQ tablet   Oral   Take 1 tablet (20 mEq total) by mouth 3 (three) times daily. Patient not taking: Reported on 06/14/2015   60 tablet  0   . sucralfate (CARAFATE) 1 G tablet   Oral   Take 1 g by mouth 4 (four) times daily -  with meals and at bedtime.           Allergies Review of patient's allergies indicates no known allergies.  History reviewed. No pertinent family history.  Social History Social History  Substance Use Topics  . Smoking status: Current Every Day Smoker -- 0.25 packs/day for 25 years    Types: Cigarettes  . Smokeless tobacco: None  . Alcohol Use: 4.8 oz/week    8 Cans of beer per week    Review of Systems Constitutional: No fever/chills Eyes: No visual changes. ENT: No sore throat. Cardiovascular: Denies chest pain. Respiratory: Denies shortness of breath. Gastrointestinal: No abdominal pain.  No nausea, no vomiting.  No diarrhea.  No constipation. Genitourinary: Negative for dysuria. Musculoskeletal: Negative for back pain. Skin: Negative for rash. Neurological:  Positive for headaches. Positive for seizures. Negative for focal weakness or numbness.  10-point ROS otherwise negative.  ____________________________________________   PHYSICAL EXAM:  VITAL SIGNS: ED Triage Vitals  Enc Vitals Group     BP --      Pulse --      Resp --      Temp --      Temp src --      SpO2 --      Weight --      Height --      Head Cir --      Peak Flow --      Pain Score --      Pain Loc --      Pain Edu? --      Excl. in GC? --     Constitutional: Alert and oriented. Well appearing and in mild acute distress. Eyes: Conjunctivae are normal. PERRL. EOMI. Head: Atraumatic. Healing ecchymosis to left face. Nose: No congestion/rhinnorhea. Mouth/Throat: Mucous membranes are moist.  Oropharynx non-erythematous. Neck: No stridor. No cervical spine tenderness to palpation. Cardiovascular: Normal rate, regular rhythm. Grossly normal heart sounds.  Good peripheral circulation. Respiratory: Normal respiratory effort.  No retractions. Lungs CTAB. Gastrointestinal: Soft and nontender. No distention. No abdominal bruits. No CVA tenderness. Musculoskeletal: No lower extremity tenderness nor edema.  No joint effusions. Neurologic:  Normal speech and language. No gross focal neurologic deficits are appreciated. +EtOH. Skin:  Skin is warm, dry and intact. No rash noted. Psychiatric: Mood and affect are normal. Speech and behavior are normal.  ____________________________________________   LABS (all labs ordered are listed, but only abnormal results are displayed)  Labs Reviewed  CBC WITH DIFFERENTIAL/PLATELET - Abnormal; Notable for the following:    Hemoglobin 11.2 (*)    HCT 34.9 (*)    MCV 77.3 (*)    MCH 24.8 (*)    RDW 20.1 (*)    All other components within normal limits  COMPREHENSIVE METABOLIC PANEL - Abnormal; Notable for the following:    Glucose, Bld 108 (*)    BUN 5 (*)    Calcium 8.7 (*)    AST 109 (*)    Alkaline Phosphatase 134 (*)    All  other components within normal limits  ETHANOL - Abnormal; Notable for the following:    Alcohol, Ethyl (B) 317 (*)    All other components within normal limits  AMMONIA - Abnormal; Notable for the following:    Ammonia 44 (*)    All other components within normal limits  URINE DRUG SCREEN, QUALITATIVE (  [facility] ONLY) - Abnormal; Notable for the following:    Cocaine Metabolite,Ur [place] POSITIVE (*)    Benzodiazepine, Ur Scrn POSITIVE (*)    All other components within normal limits  ACETAMINOPHEN LEVEL - Abnormal; Notable for the following:    Acetaminophen (Tylenol), Serum <10 (*)    All other components within normal limits  SALICYLATE LEVEL  PROTIME-INR   ____________________________________________  EKG  ED ECG REPORT I, Rolondo Pierre J, the attending physician, personally viewed and interpreted this ECG.   Date: 07/25/2015  EKG Time: 0253  Rate: 88  Rhythm: normal EKG, normal sinus rhythm  Axis: Normal  Intervals:none  ST&T Change: Nonspecific  ____________________________________________  RADIOLOGY  CT head without contrast discussed with Dr. Karie Kirks: Small RIGHT frontoparietal subdural hematoma may be hyperacute versus subacute. No mass effect.  Cerebellar volume loss for age can be seen with chronic seizure medication or alcohol use.  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 ____________________________________________   PROCEDURES  Procedure(s) performed: None  Critical Care performed: Yes, see critical care note(s)  CRITICAL CARE Performed by: Irean Hong   Total critical care time: 30 minutes  Critical care time was exclusive of separately billable procedures and treating other patients.  Critical care was necessary to treat or prevent imminent or life-threatening deterioration.  Critical care was time spent personally by me on the following activities: development of treatment plan with patient and/or surrogate as well as nursing,  discussions with consultants, evaluation of patient's response to treatment, examination of patient, obtaining history from patient or surrogate, ordering and performing treatments and interventions, ordering and review of laboratory studies, ordering and review of radiographic studies, pulse oximetry and re-evaluation of patient's condition.  ____________________________________________   INITIAL IMPRESSION / ASSESSMENT AND PLAN / ED COURSE  Pertinent labs & imaging results that were available during my care of the patient were reviewed by me and considered in my medical decision making (see chart for details).  44 year old male who presents with right-sided headache and probable seizure. Patient is a heavy alcohol user with history of frequent falls and past history of subdural hematoma. Will obtain CT head, check screening labs including salicylate, ammonia and INR. IV Ativan administered for seizure prophylaxis.  ----------------------------------------- 5:22 AM on 07/25/2015 -----------------------------------------  Accepted by Dr. Kathrynn Running [facility] ED). Updated patient and family member of laboratory and imaging results. ____________________________________________   FINAL CLINICAL IMPRESSION(S) / ED DIAGNOSES  Final diagnoses:  Seizure  Alcohol intoxication, uncomplicated  Subdural hematoma  Cocaine use      Irean Hong, MD 07/25/15 [phone]

## 2015-07-25 NOTE — ED Notes (Signed)
Pt wont wear oxygen as ordered still in pain 10/10 to right side of head and neck.

## 2015-07-25 NOTE — ED Notes (Signed)
MD at bedside. 

## 2015-07-25 NOTE — ED Notes (Signed)
Pt presents via EMS from home EMS reports possible seizure pt was talking to girlfriend  said he was talking one minute then dropped the phone she called his mother who said he was [place] called EMS. EMS came saided he was normal on arrival hx of seizures and fall. Pt was on the couch no fall tonight. Pt reports pain to right side of head and neck for 2 weeks worse headache. Pt had a bad seizure last Saturday and noted brusies and scabs to arms and legs. Pt does drink ETOH two 40oz beers today. Neuro WNL.

## 2015-07-25 NOTE — ED Notes (Signed)
Patient requesting something to drink, MD notified, MD states to give patient ice chips instead.

## 2015-10-11 IMAGING — CT CT HEAD W/O CM
1 series · 15 of 30 positions shown, 19 images · non-contrast
Comparison: CT head July 03, 2015

CLINICAL DATA: Fell July 03, 2015. Witnessed seizure on couch.
History of seizures, cirrhosis, hypertension, stroke.

EXAM:
CT HEAD WITHOUT CONTRAST
TECHNIQUE: Contiguous axial images were obtained from the base of the skull
through the vertex without intravenous contrast.

[Series 2: head wo · axial · 0.43mm/px · z∈[+196,+340]mm · 15 of 36 slices shown, 19 images]
[im 2/36  brain]
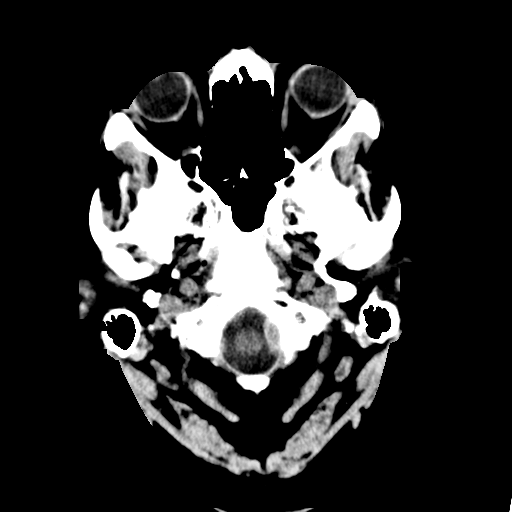
[im 2/36  bone]
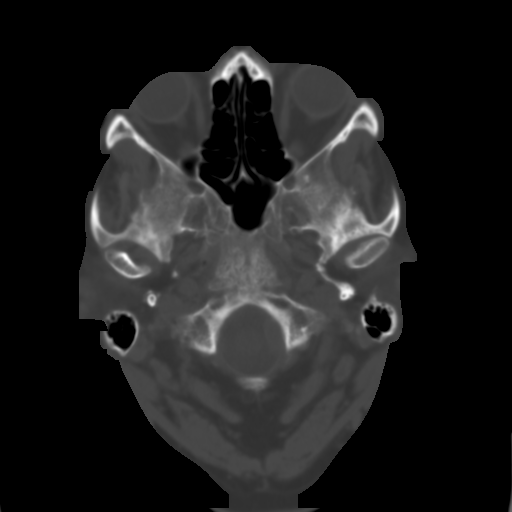
[im 4/36  brain]
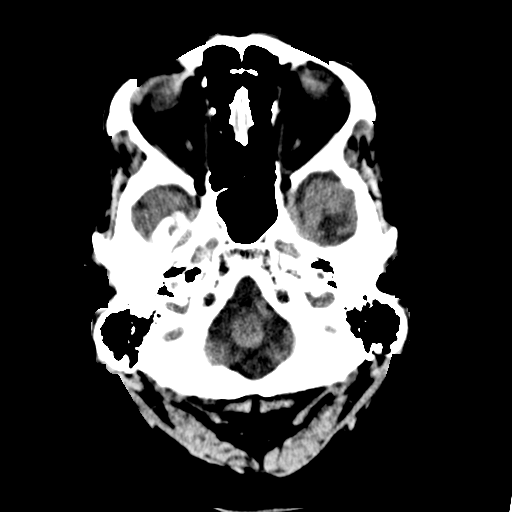
[im 7/36  brain]
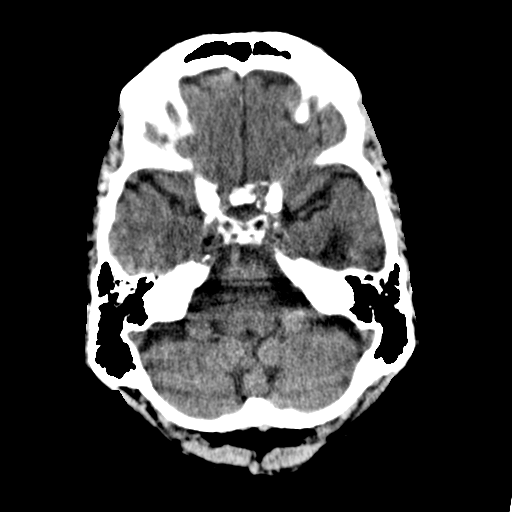
[im 9/36  brain]
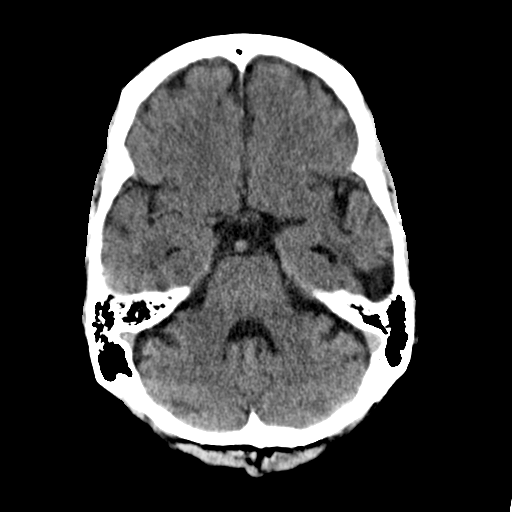
[im 11/36  brain]
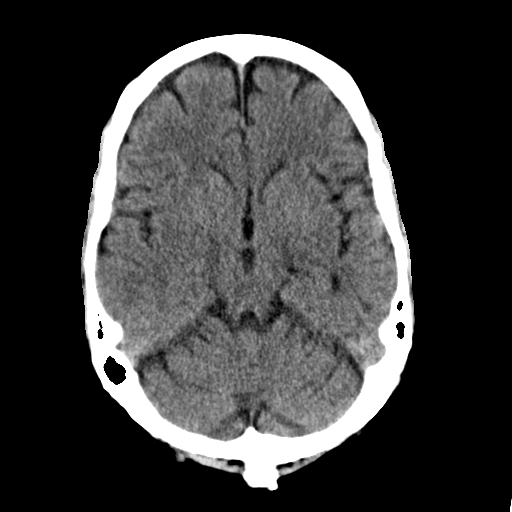
[im 11/36  bone]
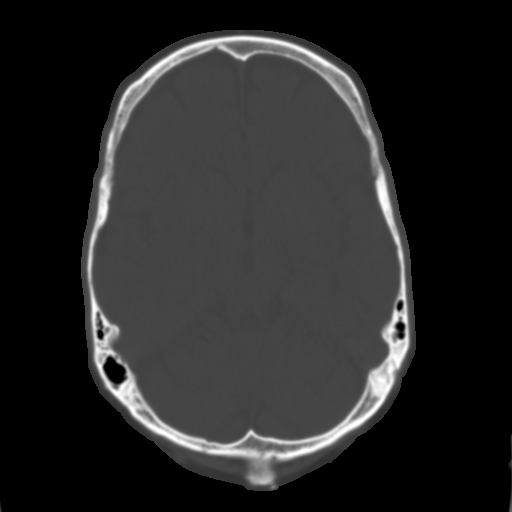
[im 14/36  brain]
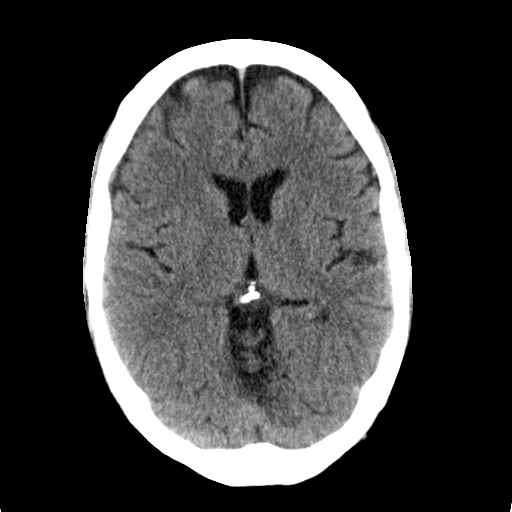
[im 16/36  brain]
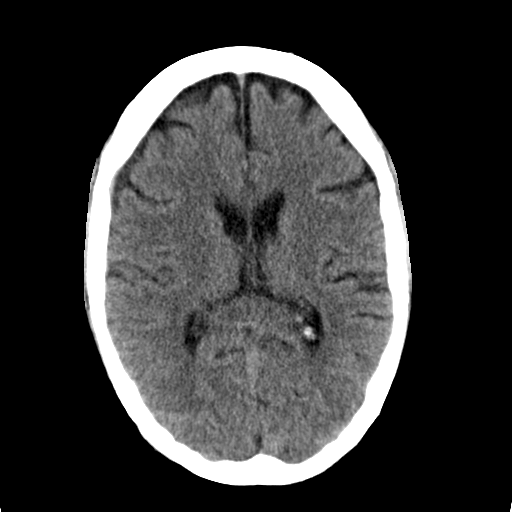
[im 19/36  brain]
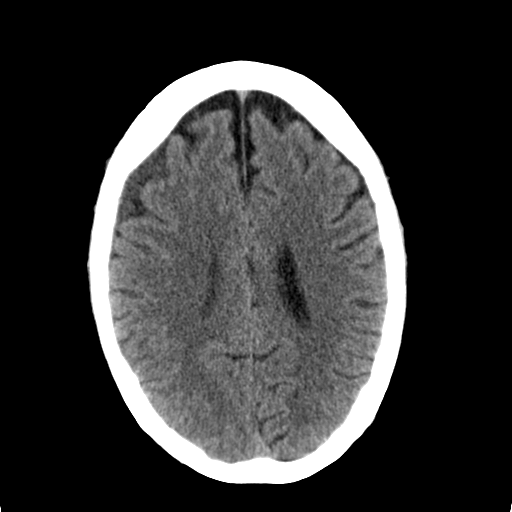
[im 20/36  brain]
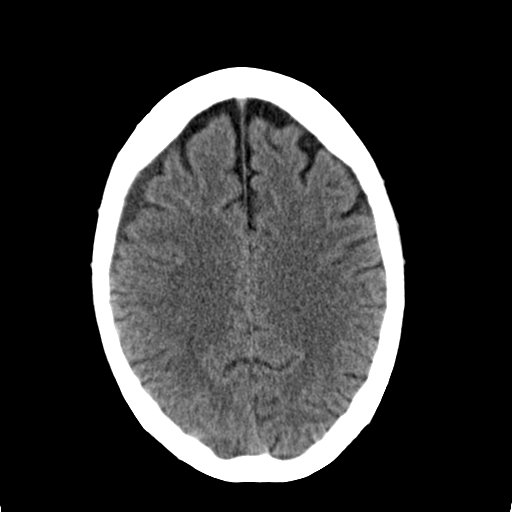
[im 20/36  bone]
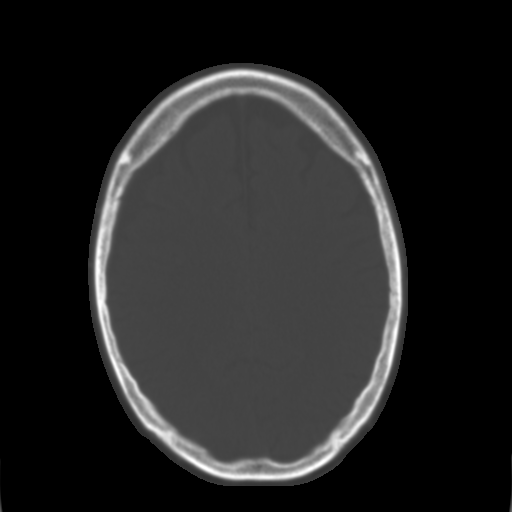
[im 22/36  brain]
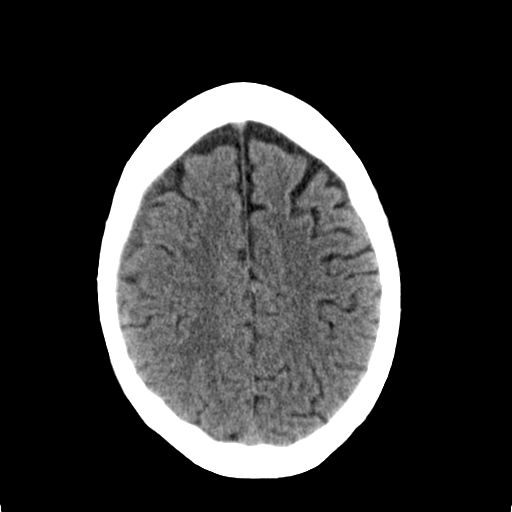
[im 25/36  brain]
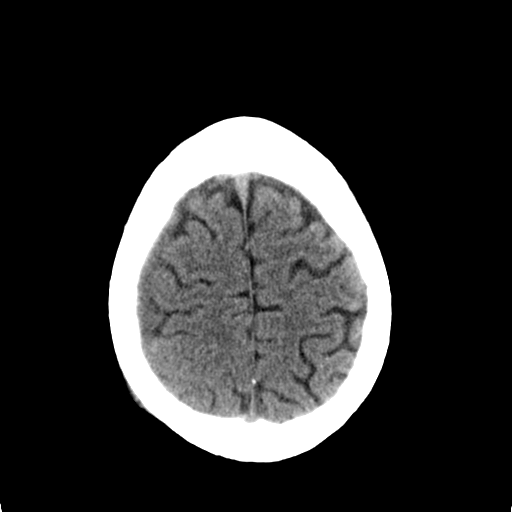
[im 27/36  brain]
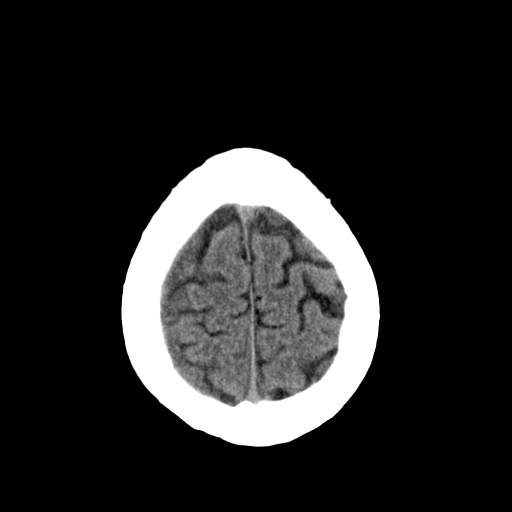
[im 29/36  brain]
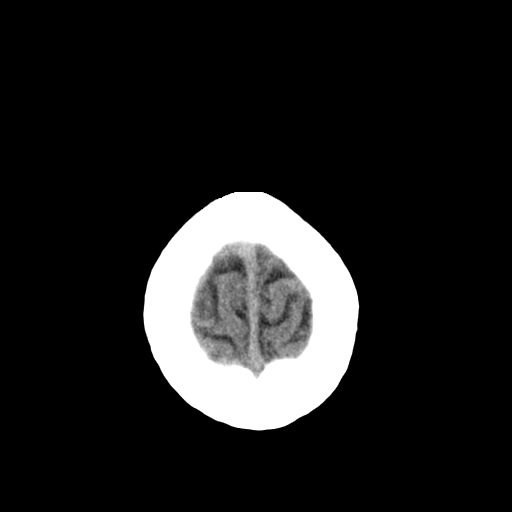
[im 29/36  bone]
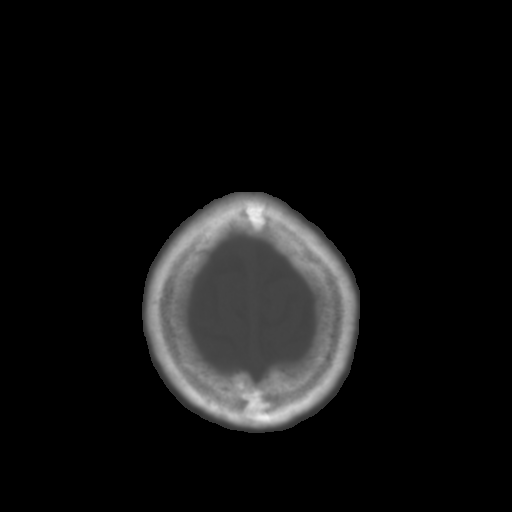
[im 32/36  brain]
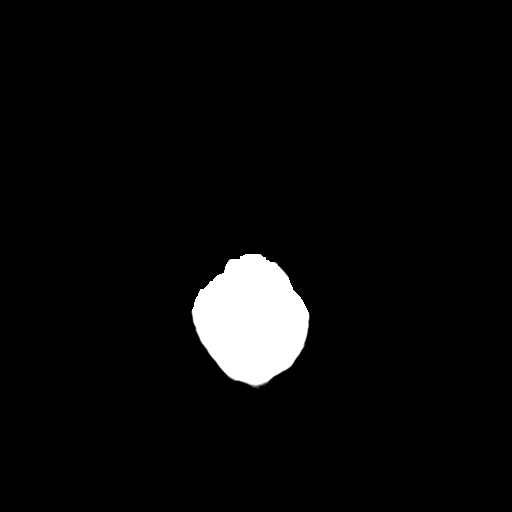
[im 34/36  brain]
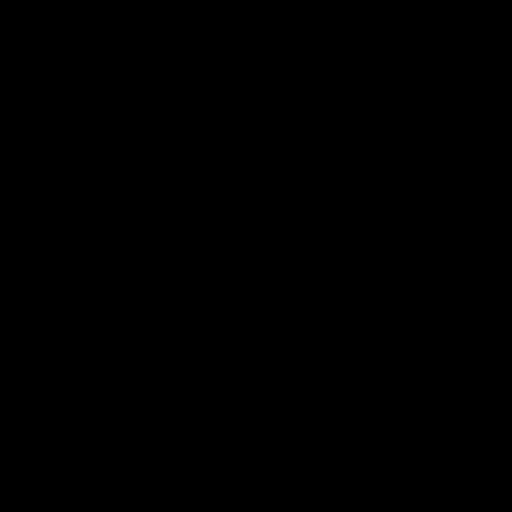

[15 of 30 positions shown; findings below may reference images not displayed]

FINDINGS: No intraparenchymal hemorrhage, mass effect, midline shift. Mild
cerebellar volume loss for age. LEFT temporal lobe encephalomalacia
with mild ex vacuo dilatation of LEFT temporal horn, no
hydrocephalus.

5 mm intermediate RIGHT frontoparietal subdural hematoma at the
convexity. Basal cisterns are patent. Mild calcific atherosclerosis
of the carotid siphons. Old RIGHT medial orbital blowout fracture.
Paranasal sinuses and mastoid air cells are well aerated. Soft
tissue within the bilateral external auditory canals most consistent
with cerumen.
IMPRESSION: Small RIGHT frontoparietal subdural hematoma may be hyperacute
versus subacute. No mass effect.

Cerebellar volume loss for age can be seen with chronic seizure
medication or alcohol use.

LEFT temporal encephalomalacia is likely posttraumatic.

Acute findings discussed with and reconfirmed by Dr.RANJANI SWARTZ on
07/25/2015 at [DATE].

## 2015-11-11 ENCOUNTER — Other Ambulatory Visit: Payer: Self-pay | Admitting: Gastroenterology

## 2015-11-11 DIAGNOSIS — K7030 Alcoholic cirrhosis of liver without ascites: Secondary | ICD-10-CM

## 2015-11-15 ENCOUNTER — Ambulatory Visit
Admission: RE | Admit: 2015-11-15 | Discharge: 2015-11-15 | Disposition: A | Discharge status: Home / Self Care | Payer: Medicaid Other | Source: Ambulatory Visit | Attending: Gastroenterology | Admitting: Gastroenterology

## 2015-11-15 DIAGNOSIS — I8500 Esophageal varices without bleeding: Secondary | ICD-10-CM | POA: Insufficient documentation

## 2015-11-15 DIAGNOSIS — K766 Portal hypertension: Secondary | ICD-10-CM | POA: Diagnosis not present

## 2015-11-15 DIAGNOSIS — K7030 Alcoholic cirrhosis of liver without ascites: Secondary | ICD-10-CM | POA: Insufficient documentation

## 2015-11-15 DIAGNOSIS — R935 Abnormal findings on diagnostic imaging of other abdominal regions, including retroperitoneum: Secondary | ICD-10-CM | POA: Insufficient documentation

## 2015-11-15 MED ORDER — GADOXETATE DISODIUM 0.25 MMOL/ML IV SOLN
10.0000 mL | Freq: Once | INTRAVENOUS | Status: AC | PRN
  Administered 2015-11-15: 7 mL via INTRAVENOUS

## 2015-12-18 ENCOUNTER — Emergency Department
Admission: EM | Admit: 2015-12-18 | Discharge: 2015-12-18 | Disposition: A | Discharge status: Home / Self Care | Payer: Medicaid Other | Attending: Student | Admitting: Student

## 2015-12-18 DIAGNOSIS — G40909 Epilepsy, unspecified, not intractable, without status epilepticus: Secondary | ICD-10-CM | POA: Insufficient documentation

## 2015-12-18 DIAGNOSIS — R569 Unspecified convulsions: Secondary | ICD-10-CM | POA: Diagnosis present

## 2015-12-18 DIAGNOSIS — R000 Tachycardia, unspecified: Secondary | ICD-10-CM | POA: Diagnosis not present

## 2015-12-18 DIAGNOSIS — I10 Essential (primary) hypertension: Secondary | ICD-10-CM | POA: Diagnosis not present

## 2015-12-18 DIAGNOSIS — F1210 Cannabis abuse, uncomplicated: Secondary | ICD-10-CM | POA: Diagnosis not present

## 2015-12-18 DIAGNOSIS — F17210 Nicotine dependence, cigarettes, uncomplicated: Secondary | ICD-10-CM | POA: Insufficient documentation

## 2015-12-18 DIAGNOSIS — Z79899 Other long term (current) drug therapy: Secondary | ICD-10-CM | POA: Diagnosis not present

## 2015-12-18 DIAGNOSIS — F1410 Cocaine abuse, uncomplicated: Secondary | ICD-10-CM | POA: Insufficient documentation

## 2015-12-18 LAB — URINE DRUG SCREEN, QUALITATIVE (ARMC ONLY)
AMPHETAMINES, UR SCREEN: NOT DETECTED
Barbiturates, Ur Screen: NOT DETECTED
Benzodiazepine, Ur Scrn: NOT DETECTED
CANNABINOID 50 NG, UR ~~LOC~~: POSITIVE — AB
COCAINE METABOLITE, UR ~~LOC~~: POSITIVE — AB
MDMA (ECSTASY) UR SCREEN: NOT DETECTED
Methadone Scn, Ur: NOT DETECTED
Opiate, Ur Screen: NOT DETECTED
PHENCYCLIDINE (PCP) UR S: NOT DETECTED
Tricyclic, Ur Screen: NOT DETECTED

## 2015-12-18 LAB — CBC WITH DIFFERENTIAL/PLATELET
BASOS ABS: 0 10*3/uL (ref 0–0.1)
Basophils Relative: 0 %
EOS PCT: 1 %
Eosinophils Absolute: 0.1 10*3/uL (ref 0–0.7)
HEMATOCRIT: 29.7 % — AB (ref 40.0–52.0)
HEMOGLOBIN: 8.9 g/dL — AB (ref 13.0–18.0)
LYMPHS ABS: 0.9 10*3/uL — AB (ref 1.0–3.6)
LYMPHS PCT: 10 %
MCH: 21.5 pg — AB (ref 26.0–34.0)
MCHC: 30.1 g/dL — ABNORMAL LOW (ref 32.0–36.0)
MCV: 71.7 fL — AB (ref 80.0–100.0)
Monocytes Absolute: 0.8 10*3/uL (ref 0.2–1.0)
Monocytes Relative: 9 %
Neutro Abs: 7.3 10*3/uL — ABNORMAL HIGH (ref 1.4–6.5)
Neutrophils Relative %: 80 %
Platelets: 260 10*3/uL (ref 150–440)
RBC: 4.15 MIL/uL — AB (ref 4.40–5.90)
RDW: 20.3 % — ABNORMAL HIGH (ref 11.5–14.5)
WBC: 9.1 10*3/uL (ref 3.8–10.6)

## 2015-12-18 LAB — BASIC METABOLIC PANEL
ANION GAP: 13 (ref 5–15)
BUN: 6 mg/dL (ref 6–20)
CHLORIDE: 102 mmol/L (ref 101–111)
CO2: 22 mmol/L (ref 22–32)
Calcium: 9 mg/dL (ref 8.9–10.3)
Creatinine, Ser: 0.76 mg/dL (ref 0.61–1.24)
GFR calc Af Amer: >60 mL/min (ref >60)
GLUCOSE: 110 mg/dL — AB (ref 65–99)
POTASSIUM: 3.4 mmol/L — AB (ref 3.5–5.1)
Sodium: 137 mmol/L (ref 135–145)

## 2015-12-18 LAB — ETHANOL: ALCOHOL ETHYL (B): 29 mg/dL — AB (ref <5)

## 2015-12-18 MED ORDER — LACOSAMIDE 200 MG PO TABS
200.0000 mg | ORAL_TABLET | Freq: Once | ORAL | Status: AC
  Administered 2015-12-18: 200 mg via ORAL
  Filled 2015-12-18: qty 1

## 2015-12-18 MED ORDER — SODIUM CHLORIDE 0.9 % IV BOLUS (SEPSIS)
1000.0000 mL | Freq: Once | INTRAVENOUS | Status: AC
  Administered 2015-12-18: 1000 mL via INTRAVENOUS

## 2015-12-18 NOTE — ED Provider Notes (Signed)
[facility] Emergency Department Provider Note  ____________________________________________  Time seen: Approximately 5:48 PM  I have reviewed the triage vital signs and the nursing notes.   HISTORY  Chief Complaint Seizures    HPI Seth [name] is a 45 y.o. male with history of seizure disorder on Vimpat, hypertension, doesn't abuse including alcoholism as well as cocaine use who presents for evaluation of a witnessed generalized tonic-clonic seizure today, gradual onset, now resolved. Patient reports that he "felt it coming on" sat in a chair and had generalized tonic clonic activity that was witnessed by his significant other. He did not fall out of the chair. His next memory is of the paramedics standing over him. Currently he reports that his body feels sore but he has no other complaints. He did not take his Vimpat yesterday. He last ate alcohol this morning and also snorted cocaine yesterday. He has no chest pain or difficulty breathing. He reports that last week he did have some blood in his stools but he feels as if this has resolved. No hematemesis.   Past Medical History  Diagnosis Date  . Hypertension   . Seizures (HCC)   . Cirrhosis (HCC)   . Chronic back pain     Patient Active Problem List   Diagnosis Date Noted  . 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 Seizure disorder (HCC) 02/04/2013  . Hypertension 02/04/2013  . Hyponatremia 02/04/2013  . Hypokalemia 02/04/2013    History reviewed. No pertinent past surgical history.  Current Outpatient Rx  Name  Route  Sig  Dispense  Refill  . amLODipine (NORVASC) 10 MG tablet   Oral   Take 10 mg by mouth daily.         . carvedilol (COREG) 6.25 MG tablet   Oral   Take 6.25 mg by mouth 2 (two) times daily.         . chlorthalidone (HYGROTON) 25 MG tablet   Oral   Take 25 mg by mouth daily.         . diazepam (VALIUM) 5 MG tablet   Oral   Take 5 mg by mouth 3  (three) times daily.         Marland Kitchen gabapentin (NEURONTIN) 400 MG capsule   Oral   Take 400 mg by mouth 3 (three) times daily.         Marland Kitchen lacosamide (VIMPAT) 200 MG TABS tablet   Oral   Take 200 mg by mouth 2 (two) times daily.         Marland Kitchen levETIRAcetam (KEPPRA) 1000 MG tablet   Oral   Take 1 tablet (1,000 mg total) by mouth 2 (two) times daily. Patient not taking: Reported on 06/14/2015   60 tablet   1   . omeprazole (PRILOSEC) 20 MG capsule   Oral   Take 20 mg by mouth 2 (two) times daily.         Marland Kitchen oxyCODONE-acetaminophen (PERCOCET/ROXICET) 5-325 MG per tablet   Oral   Take 1-2 tablets by mouth every 4 (four) hours as needed. Patient not taking: Reported on 06/14/2015   100 tablet   0   . potassium chloride SA (K-DUR,KLOR-CON) 20 MEQ tablet   Oral   Take 1 tablet (20 mEq total) by mouth 3 (three) times daily. Patient not taking: Reported on 06/14/2015   60 tablet   0   . sucralfate (CARAFATE) 1 G tablet   Oral   Take 1 g by mouth  4 (four) times daily -  with meals and at bedtime.           Allergies Review of patient's allergies indicates no known allergies.  No family history on file.  Social History Social History  Substance Use Topics  . Smoking status: Current Every Day Smoker -- 0.25 packs/day for 25 years    Types: Cigarettes  . Smokeless tobacco: None  . Alcohol Use: 4.8 oz/week    8 Cans of beer per week    Review of Systems Constitutional: No fever/chills Eyes: No visual changes. ENT: No sore throat. Cardiovascular: Denies chest pain. Respiratory: Denies shortness of breath. Gastrointestinal: No abdominal pain.  No nausea, no vomiting.  No diarrhea.  No constipation. Genitourinary: Negative for dysuria. Musculoskeletal: Negative for back pain. Skin: Negative for rash. Neurological: Negative for headaches, focal weakness or numbness.  10-point ROS otherwise negative.  ____________________________________________   PHYSICAL EXAM:  VITAL  SIGNS: ED Triage Vitals  Enc Vitals Group     BP 12/18/15 1620 147/74 mmHg     Pulse Rate 12/18/15 1618 103     Resp 12/18/15 1618 18     Temp 12/18/15 1619 98.8 F (37.1 C)     Temp src --      SpO2 12/18/15 1618 94 %     Weight 12/18/15 1615 170 lb (77.111 kg)     Height 12/18/15 1615 5\' 7"  (1.702 m)     Head Cir --      Peak Flow --      Pain Score 12/18/15 1616 8     Pain Loc --      Pain Edu? --      Excl. in GC? --     Constitutional: Alert and oriented. Well appearing and in no acute distress. Eyes: Conjunctivae are normal. PERRL. EOMI. Head: Atraumatic. Nose: No congestion/rhinnorhea. Mouth/Throat: Mucous membranes are moist.  Oropharynx non-erythematous. Neck: No stridor. Cardiovascular: Mildly tachycardic rate, regular rhythm. Grossly normal heart sounds.  Good peripheral circulation. Respiratory: Normal respiratory effort.  No retractions. Lungs CTAB. Gastrointestinal: Soft and nontender. No distention. No CVA tenderness. Genitourinary: deferred Rectal: clear mucous in the rectal vault it trace guaiac positive Musculoskeletal: No lower extremity tenderness nor edema.  No joint effusions. Neurologic:  Normal speech and language. No gross focal neurologic deficits are appreciated.  Skin:  Skin is warm, dry and intact. No rash noted. Psychiatric: Mood and affect are normal. Speech and behavior are normal.  ____________________________________________   LABS (all labs ordered are listed, but only abnormal results are displayed)  Labs Reviewed  CBC WITH DIFFERENTIAL/PLATELET - Abnormal; Notable for the following:    RBC 4.15 (*)    Hemoglobin 8.9 (*)    HCT 29.7 (*)    MCV 71.7 (*)    MCH 21.5 (*)    MCHC 30.1 (*)    RDW 20.3 (*)    Neutro Abs 7.3 (*)    Lymphs Abs 0.9 (*)    All other components within normal limits  BASIC METABOLIC PANEL - Abnormal; Notable for the following:    Potassium 3.4 (*)    Glucose, Bld 110 (*)    All other components within  normal limits  URINE DRUG SCREEN, QUALITATIVE ([facility] ONLY) - Abnormal; Notable for the following:    Cocaine Metabolite,Ur [place] POSITIVE (*)    Cannabinoid 50 Ng, Ur [place] POSITIVE (*)    All other components within normal limits  ETHANOL - Abnormal; Notable for the following:  Alcohol, Ethyl (B) 29 (*)    All other components within normal limits   ____________________________________________  EKG  ED ECG REPORT I, Gayla Doss, the attending physician, personally viewed and interpreted this ECG.   Date: 12/18/2015  EKG Time: 16:14  Rate: 101  Rhythm: sinus tachycardia  Axis: normal  Intervals:none  ST&T Change: No acute ST elevation.  ____________________________________________  RADIOLOGY  none ____________________________________________   PROCEDURES  Procedure(s) performed: None  Critical Care performed: No  ____________________________________________   INITIAL IMPRESSION / ASSESSMENT AND PLAN / ED COURSE  Pertinent labs & imaging results that were available during my care of the patient were reviewed by me and considered in my medical decision making (see chart for details).  Seth [name] is a 45 y.o. male with history of seizure disorder on Vimpat, hypertension, doesn't abuse including alcoholism as well as cocaine use who presents for evaluation of a witnessed generalized tonic-clonic seizure today, gradual onset, now resolved. On exam, he is well-appearing and in no acute distress. Mildly tachycardic but vital signs otherwise stable, he is afebrile. He has an intact neurological examination and no specific pain complaints at this time. Suspect not complied with Vimpat, alcohol use as well as cocaine use has lowered his seizure threshold. We'll give his home dose of Vimpat, obtain screening labs and observed briefly in the emergency department.  ----------------------------------------- 7:32 PM on 12/18/2015 -----------------------------------------  Sugars  are greater than 3 hours in the emergency department without recurrence of seizure activity. Labs reviewed. BMP is notable for very mild hypokalemia with potassium 3.4. CBC notable for anemia with hemoglobin 8.9, hemoglobin was approximately 11 a few months ago but has been as low as it is today approximately 1 year ago. He has clear mucous in the rectal vault which is trace guaiac positive. He is hemodynamically stable and I doubt significant GI bleed at this time I did discuss with him the need for close PCP follow-up for recheck of his hemoglobin and he is comfortable with that. He is mildly tachycardic at this time and I suspect he may be in early onset of alcohol withdrawal, he does not desire detox therefore we'll discharge at this time. Discussed return precautions, need for close PCP follow-up and he is comfortable with the discharge plan. ____________________________________________   FINAL CLINICAL IMPRESSION(S) / ED DIAGNOSES  Final diagnoses:  Seizure (HCC)      Gayla Doss, MD 12/18/15 1935

## 2015-12-18 NOTE — ED Notes (Addendum)
Pt arrived from home via EMS. Reports having a seizure at home, hx of seizures and HTN. Pt reports he drinks 12 cans of beer a day, reports he did a gram of cocaine yesterday

## 2016-01-07 ENCOUNTER — Encounter: Payer: Self-pay | Admitting: Emergency Medicine

## 2016-01-07 ENCOUNTER — Emergency Department
Admission: EM | Admit: 2016-01-07 | Discharge: 2016-01-07 | Disposition: A | Discharge status: Home / Self Care | Payer: Medicaid Other | Attending: Student | Admitting: Student

## 2016-01-07 ENCOUNTER — Emergency Department: Payer: Medicaid Other

## 2016-01-07 DIAGNOSIS — F1310 Sedative, hypnotic or anxiolytic abuse, uncomplicated: Secondary | ICD-10-CM | POA: Insufficient documentation

## 2016-01-07 DIAGNOSIS — R569 Unspecified convulsions: Secondary | ICD-10-CM

## 2016-01-07 DIAGNOSIS — F17210 Nicotine dependence, cigarettes, uncomplicated: Secondary | ICD-10-CM | POA: Insufficient documentation

## 2016-01-07 DIAGNOSIS — R1013 Epigastric pain: Secondary | ICD-10-CM | POA: Insufficient documentation

## 2016-01-07 DIAGNOSIS — Z79899 Other long term (current) drug therapy: Secondary | ICD-10-CM | POA: Diagnosis not present

## 2016-01-07 DIAGNOSIS — R197 Diarrhea, unspecified: Secondary | ICD-10-CM | POA: Diagnosis not present

## 2016-01-07 DIAGNOSIS — F1210 Cannabis abuse, uncomplicated: Secondary | ICD-10-CM | POA: Diagnosis not present

## 2016-01-07 DIAGNOSIS — I10 Essential (primary) hypertension: Secondary | ICD-10-CM | POA: Diagnosis not present

## 2016-01-07 DIAGNOSIS — G40909 Epilepsy, unspecified, not intractable, without status epilepticus: Secondary | ICD-10-CM | POA: Insufficient documentation

## 2016-01-07 LAB — COMPREHENSIVE METABOLIC PANEL
ALBUMIN: 3.6 g/dL (ref 3.5–5.0)
ALT: 54 U/L (ref 17–63)
ANION GAP: 12 (ref 5–15)
AST: 121 U/L — AB (ref 15–41)
Alkaline Phosphatase: 169 U/L — ABNORMAL HIGH (ref 38–126)
BUN: 6 mg/dL (ref 6–20)
CHLORIDE: 97 mmol/L — AB (ref 101–111)
CO2: 24 mmol/L (ref 22–32)
Calcium: 9 mg/dL (ref 8.9–10.3)
Creatinine, Ser: 0.69 mg/dL (ref 0.61–1.24)
GFR calc non Af Amer: >60 mL/min (ref >60)
Glucose, Bld: 113 mg/dL — ABNORMAL HIGH (ref 65–99)
Potassium: 3.5 mmol/L (ref 3.5–5.1)
SODIUM: 133 mmol/L — AB (ref 135–145)
Total Bilirubin: 1 mg/dL (ref 0.3–1.2)
Total Protein: 7.4 g/dL (ref 6.5–8.1)

## 2016-01-07 LAB — URINE DRUG SCREEN, QUALITATIVE (ARMC ONLY)
AMPHETAMINES, UR SCREEN: NOT DETECTED
Barbiturates, Ur Screen: POSITIVE — AB
Benzodiazepine, Ur Scrn: POSITIVE — AB
Cannabinoid 50 Ng, Ur ~~LOC~~: POSITIVE — AB
Cocaine Metabolite,Ur ~~LOC~~: NOT DETECTED
MDMA (ECSTASY) UR SCREEN: NOT DETECTED
METHADONE SCREEN, URINE: NOT DETECTED
Opiate, Ur Screen: NOT DETECTED
Phencyclidine (PCP) Ur S: NOT DETECTED
TRICYCLIC, UR SCREEN: NOT DETECTED

## 2016-01-07 LAB — CBC WITH DIFFERENTIAL/PLATELET
BASOS ABS: 0 10*3/uL (ref 0–0.1)
BASOS PCT: 0 %
EOS PCT: 2 %
Eosinophils Absolute: 0.1 10*3/uL (ref 0–0.7)
HCT: 28.1 % — ABNORMAL LOW (ref 40.0–52.0)
Hemoglobin: 8.5 g/dL — ABNORMAL LOW (ref 13.0–18.0)
LYMPHS PCT: 12 %
Lymphs Abs: 0.9 10*3/uL — ABNORMAL LOW (ref 1.0–3.6)
MCH: 21.2 pg — ABNORMAL LOW (ref 26.0–34.0)
MCHC: 30.2 g/dL — ABNORMAL LOW (ref 32.0–36.0)
MCV: 70.1 fL — AB (ref 80.0–100.0)
MONO ABS: 0.7 10*3/uL (ref 0.2–1.0)
Monocytes Relative: 8 %
NEUTROS ABS: 6.2 10*3/uL (ref 1.4–6.5)
Neutrophils Relative %: 78 %
PLATELETS: 251 10*3/uL (ref 150–440)
RBC: 4.01 MIL/uL — AB (ref 4.40–5.90)
RDW: 19.2 % — AB (ref 11.5–14.5)
WBC: 7.9 10*3/uL (ref 3.8–10.6)

## 2016-01-07 LAB — TROPONIN I: TROPONIN I: 0.03 ng/mL (ref <0.031)

## 2016-01-07 LAB — LIPASE, BLOOD: Lipase: 33 U/L (ref 11–51)

## 2016-01-07 LAB — ETHANOL

## 2016-01-07 MED ORDER — LACOSAMIDE 200 MG PO TABS
200.0000 mg | ORAL_TABLET | Freq: Once | ORAL | Status: AC
  Administered 2016-01-07: 200 mg via ORAL
  Filled 2016-01-07: qty 1

## 2016-01-07 MED ORDER — SODIUM CHLORIDE 0.9 % IV BOLUS (SEPSIS)
500.0000 mL | Freq: Once | INTRAVENOUS | Status: AC
  Administered 2016-01-07: 500 mL via INTRAVENOUS

## 2016-01-07 MED ORDER — IBUPROFEN 600 MG PO TABS
600.0000 mg | ORAL_TABLET | Freq: Once | ORAL | Status: AC
  Administered 2016-01-07: 600 mg via ORAL
  Filled 2016-01-07: qty 1

## 2016-01-07 NOTE — ED Provider Notes (Signed)
[facility] Emergency Department Provider Note  ____________________________________________  Time seen: Approximately 3:14 PM  I have reviewed the triage vital signs and the nursing notes.   HISTORY  Chief Complaint Seizures    HPI Seth [name] is a 45 y.o. male with history of seizure disorder on Vimpat, hypertension, polysubstance abuse including alcoholism as well as cocaine and marijuana use who presents for evaluation of a witnessed generalized tonic-clonic seizure today, gradual onset, now resolved. The patient was sitting in a truck at his work site when he had a seizure with bladder incontinence, no tongue biting. No fall or trauma. On EMS arrival he was post ictal with stable vital signs and normal glucose. The patient reports that he thinks he took his Vimpat this morning but he is not sure. His last alcoholic drink was last night. He reports that he has had some epigastric discomfort as well as nonbloody diarrhea ongoing for the past 3 days. He is currently complaining of moderate headache which he always gets after seizure. No chest pain or difficulty breathing. No fevers. No vomiting. No coughing, sneezing, runny nose or nasal congestion.   Past Medical History  Diagnosis Date  . Hypertension   . Seizures (HCC)   . Cirrhosis (HCC)   . Chronic back pain     Patient Active Problem List   Diagnosis Date Noted  . 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 Seizure disorder (HCC) 02/04/2013  . Hypertension 02/04/2013  . Hyponatremia 02/04/2013  . Hypokalemia 02/04/2013    History reviewed. No pertinent past surgical history.  Current Outpatient Rx  Name  Route  Sig  Dispense  Refill  . amLODipine (NORVASC) 10 MG tablet   Oral   Take 10 mg by mouth daily.         . carvedilol (COREG) 6.25 MG tablet   Oral   Take 6.25 mg by mouth 2 (two) times daily.         . chlorthalidone (HYGROTON) 25 MG tablet   Oral   Take 25  mg by mouth daily.         . diazepam (VALIUM) 5 MG tablet   Oral   Take 5 mg by mouth 3 (three) times daily.         Marland Kitchen gabapentin (NEURONTIN) 400 MG capsule   Oral   Take 400 mg by mouth 3 (three) times daily.         Marland Kitchen lacosamide (VIMPAT) 200 MG TABS tablet   Oral   Take 200 mg by mouth 2 (two) times daily.         Marland Kitchen levETIRAcetam (KEPPRA) 1000 MG tablet   Oral   Take 1 tablet (1,000 mg total) by mouth 2 (two) times daily. Patient not taking: Reported on 06/14/2015   60 tablet   1   . omeprazole (PRILOSEC) 20 MG capsule   Oral   Take 20 mg by mouth 2 (two) times daily.         Marland Kitchen oxyCODONE-acetaminophen (PERCOCET/ROXICET) 5-325 MG per tablet   Oral   Take 1-2 tablets by mouth every 4 (four) hours as needed. Patient not taking: Reported on 06/14/2015   100 tablet   0   . potassium chloride SA (K-DUR,KLOR-CON) 20 MEQ tablet   Oral   Take 1 tablet (20 mEq total) by mouth 3 (three) times daily. Patient not taking: Reported on 06/14/2015   60 tablet   0   . sucralfate (CARAFATE) 1 G  tablet   Oral   Take 1 g by mouth 4 (four) times daily -  with meals and at bedtime.           Allergies Review of patient's allergies indicates no known allergies.  History reviewed. No pertinent family history.  Social History Social History  Substance Use Topics  . Smoking status: Current Every Day Smoker -- 0.25 packs/day for 25 years    Types: Cigarettes  . Smokeless tobacco: None  . Alcohol Use: 4.8 oz/week    8 Cans of beer per week    Review of Systems Constitutional: No fever/chills Eyes: No visual changes. ENT: No sore throat. Cardiovascular: Denies chest pain. Respiratory: Denies shortness of breath. Gastrointestinal: + abdominal pain.  No nausea, no vomiting.  N+ diarrhea.  No constipation. Genitourinary: Negative for dysuria. Musculoskeletal: Negative for back pain. Skin: Negative for rash. Neurological: Positive for headache, no focal weakness or  numbness.  10-point ROS otherwise negative.  ____________________________________________   PHYSICAL EXAM:  Filed Vitals:   01/07/16 1519 01/07/16 1600 01/07/16 1630 01/07/16 1701  BP: 127/64 132/74 110/60 139/72  Pulse: 87 91 95 92  Temp: 98.4 F (36.9 C)     TempSrc: Oral     Resp: Weight: 170 lb (77.111 kg)     SpO2: 95% 96% 97% 100%      Constitutional: Alert and orientedx4. Sitting up in bed, conversant and in no acute distress. Eyes: Conjunctivae are normal. PERRL. EOMI. Head: Atraumatic. Nose: No congestion/rhinnorhea. Mouth/Throat: Mucous membranes are moist.  Oropharynx non-erythematous. Neck: No stridor.  supple without meningismus.  {Cardiovascular: Normal rate, regular rhythm. Grossly normal heart sounds.  Good peripheral circulation. Respiratory: Normal respiratory effort.  No retractions. Lungs CTAB. Gastrointestinal: Soft and nontender. No distention.  No CVA tenderness. Genitourinary: deferred Musculoskeletal: No lower extremity tenderness nor edema.  No joint effusions. Neurologic:  Normal speech and language. No gross focal neurologic deficits are appreciated. 5 out of 5 strength in bilateral upper and lower extremities, sensation intact to light touch throughout. Cranial nerves II through XII intact.  Skin:  Skin is warm, dry and intact. No rash noted. Psychiatric: Mood and affect are normal. Speech and behavior are normal.  ____________________________________________   LABS (all labs ordered are listed, but only abnormal results are displayed)  Labs Reviewed  CBC WITH DIFFERENTIAL/PLATELET - Abnormal; Notable for the following:    RBC 4.01 (*)    Hemoglobin 8.5 (*)    HCT 28.1 (*)    MCV 70.1 (*)    MCH 21.2 (*)    MCHC 30.2 (*)    RDW 19.2 (*)    Lymphs Abs 0.9 (*)    All other components within normal limits  COMPREHENSIVE METABOLIC PANEL - Abnormal; Notable for the following:    Sodium 133 (*)    Chloride 97 (*)    Glucose,  Bld 113 (*)    AST 121 (*)    Alkaline Phosphatase 169 (*)    All other components within normal limits  URINE DRUG SCREEN, QUALITATIVE ([facility] ONLY) - Abnormal; Notable for the following:    Cannabinoid 50 Ng, Ur [place] POSITIVE (*)    Barbiturates, Ur Screen POSITIVE (*)    Benzodiazepine, Ur Scrn POSITIVE (*)    All other components within normal limits  LIPASE, BLOOD  ETHANOL  TROPONIN I   ____________________________________________  EKG  ED ECG REPORT I, Gayla Doss, the attending physician, personally viewed and interpreted this ECG.  Date: 01/07/2016  EKG Time: 15:21  Rate: 91  Rhythm: normal EKG, normal sinus rhythm  Axis: normal  Intervals:none  ST&T Change: No acute ST elevation.  ____________________________________________  RADIOLOGY  CXR IMPRESSION: No active disease.  ____________________________________________   PROCEDURES  Procedure(s) performed: None  Critical Care performed: No  ____________________________________________   INITIAL IMPRESSION / ASSESSMENT AND PLAN / ED COURSE  Pertinent labs & imaging results that were available during my care of the patient were reviewed by me and considered in my medical decision making (see chart for details).  Seth [name] is a 45 y.o. male with history of seizure disorder on Vimpat, hypertension, polysubstance abuse including alcoholism as well as cocaine and marijuana use who presents for evaluation of a witnessed generalized tonic-clonic seizure today, now resolved. On exam, he is a nontoxic-appearing and in no acute distress. He is awake, alert and oriented. Vital signs stable, he is afebrile, he is an intact neurological examination. His question of compliance with his antiepileptic medications, known alcohol and drug use could certainly lower his seizure threshold. We'll obtain screening labs, give him a dose of his home Vimpat, observe in the emergency department. Abdominal screening labs are pending  given his complaint of some epigastric discomfort as well as diarrhea though he has a benign abdominal examination.  ----------------------------------------- 6:16 PM on 01/07/2016 ----------------------------------------- Labs reviewed. Labsnotable for chronic anemia with hemoglobin of 8.5, essentially unchanged from  weeks ago. Patient denies any blood in his stool or dark tarry stools recently. He is not tachycardic or hypotensive and does not appear to have any symptoms related to his anemia. He already has follow-up scheduled with a primary care doctor for further evaluation of his anemia. CMP with mild hyponatremia, sodium 133. AST was elevated at 121, alkaline phosphatase mildly elevated at 169; LFT elevations appear chronic. Normal lipase, negative troponin. EKG not consistent with acute ischemia. Chest x-ray clear. Abdominal exam is benign, his epigastric pain may be secondary to GERD or possibly alcoholic gastritis but he appears comfortable, is tolerating by mouth in the emergency department. Urine drug screen is positive for cannabis, barbiturates and benzos. We discussed alcohol cessation, need for compliance with all medications, need for close PCP follow-up and he is comfortable with the discharge plan. He has been observed for 3 hours in the emergency department without any recurrent seizure activity. He remains awake, alert, oriented with an intact neurological examination.  ____________________________________________   FINAL CLINICAL IMPRESSION(S) / ED DIAGNOSES  Final diagnoses:  Seizure (HCC)      Gayla Doss, MD 01/07/16 Rickey Primus

## 2016-01-07 NOTE — ED Notes (Addendum)
Pt to ed with c/o seizure while at work today. Ems reports pt had witnessed grand mal seizure that lasted approx 2 minutes,  Pt was incontinent of urine, but no oral trauma noted. Pt with IV in place per ems in left AC 18g saline lock,  Pt alert and oriented on arrival to er.  Per ems he was post ictal most of the way to ed.

## 2016-02-10 ENCOUNTER — Emergency Department
Admission: EM | Admit: 2016-02-10 | Discharge: 2016-02-10 | Disposition: A | Discharge status: Home / Self Care | Payer: Medicaid Other | Attending: Internal Medicine | Admitting: Internal Medicine

## 2016-02-10 ENCOUNTER — Encounter: Payer: Self-pay | Admitting: *Deleted

## 2016-02-10 DIAGNOSIS — F10220 Alcohol dependence with intoxication, uncomplicated: Secondary | ICD-10-CM | POA: Insufficient documentation

## 2016-02-10 DIAGNOSIS — K625 Hemorrhage of anus and rectum: Secondary | ICD-10-CM | POA: Diagnosis not present

## 2016-02-10 DIAGNOSIS — I10 Essential (primary) hypertension: Secondary | ICD-10-CM | POA: Insufficient documentation

## 2016-02-10 DIAGNOSIS — F17210 Nicotine dependence, cigarettes, uncomplicated: Secondary | ICD-10-CM | POA: Diagnosis not present

## 2016-02-10 DIAGNOSIS — F10920 Alcohol use, unspecified with intoxication, uncomplicated: Secondary | ICD-10-CM

## 2016-02-10 DIAGNOSIS — K7030 Alcoholic cirrhosis of liver without ascites: Secondary | ICD-10-CM

## 2016-02-10 HISTORY — DX: Anemia, unspecified: D64.9

## 2016-02-10 LAB — COMPREHENSIVE METABOLIC PANEL
ALK PHOS: 162 U/L — AB (ref 38–126)
ALT: 42 U/L (ref 17–63)
AST: 96 U/L — AB (ref 15–41)
Albumin: 4 g/dL (ref 3.5–5.0)
Anion gap: 11 (ref 5–15)
BILIRUBIN TOTAL: 0.5 mg/dL (ref 0.3–1.2)
BUN: 5 mg/dL — AB (ref 6–20)
CALCIUM: 9 mg/dL (ref 8.9–10.3)
CO2: 26 mmol/L (ref 22–32)
CREATININE: 0.74 mg/dL (ref 0.61–1.24)
Chloride: 102 mmol/L (ref 101–111)
Glucose, Bld: 109 mg/dL — ABNORMAL HIGH (ref 65–99)
Potassium: 4.2 mmol/L (ref 3.5–5.1)
Sodium: 139 mmol/L (ref 135–145)
TOTAL PROTEIN: 8 g/dL (ref 6.5–8.1)

## 2016-02-10 LAB — CBC
HCT: 29.4 % — ABNORMAL LOW (ref 40.0–52.0)
Hemoglobin: 8.9 g/dL — ABNORMAL LOW (ref 13.0–18.0)
MCH: 21.2 pg — ABNORMAL LOW (ref 26.0–34.0)
MCHC: 30.4 g/dL — ABNORMAL LOW (ref 32.0–36.0)
MCV: 69.8 fL — ABNORMAL LOW (ref 80.0–100.0)
Platelets: 245 K/uL (ref 150–440)
RBC: 4.21 MIL/uL — ABNORMAL LOW (ref 4.40–5.90)
RDW: 19.3 % — ABNORMAL HIGH (ref 11.5–14.5)
WBC: 7.9 K/uL (ref 3.8–10.6)

## 2016-02-10 LAB — LIPASE, BLOOD: LIPASE: 39 U/L (ref 11–51)

## 2016-02-10 LAB — ABO/RH: ABO/RH(D): O POS

## 2016-02-10 LAB — TYPE AND SCREEN
ABO/RH(D): O POS
ANTIBODY SCREEN: NEGATIVE

## 2016-02-10 LAB — PROTIME-INR
INR: 1.03
Prothrombin Time: 13.7 seconds (ref 11.4–15.0)

## 2016-02-10 LAB — ETHANOL: Alcohol, Ethyl (B): 334 mg/dL (ref <5)

## 2016-02-10 MED ORDER — FERROUS SULFATE 325 (65 FE) MG PO TABS
325.0000 mg | ORAL_TABLET | Freq: Two times a day (BID) | ORAL | Status: DC
  Administered 2016-02-10: 325 mg via ORAL

## 2016-02-10 MED ORDER — POLYETHYLENE GLYCOL 3350 17 G PO PACK
PACK | ORAL | Status: AC
  Administered 2016-02-10: 17 g via ORAL
  Filled 2016-02-10: qty 1

## 2016-02-10 MED ORDER — POLYETHYLENE GLYCOL 3350 17 G PO PACK
17.0000 g | PACK | Freq: Every day | ORAL | Status: DC | PRN
  Administered 2016-02-10: 17 g via ORAL

## 2016-02-10 MED ORDER — HYDROCORTISONE ACETATE 25 MG RE SUPP
25.0000 mg | Freq: Two times a day (BID) | RECTAL | Status: DC
  Filled 2016-02-10: qty 1

## 2016-02-10 MED ORDER — POLYETHYLENE GLYCOL 3350 17 G PO PACK: 17.0000 g | PACK | Freq: Every day | ORAL | Status: DC | PRN

## 2016-02-10 MED ORDER — FERROUS SULFATE 325 (65 FE) MG PO TABS: 325.0000 mg | ORAL_TABLET | Freq: Two times a day (BID) | ORAL | Status: DC

## 2016-02-10 MED ORDER — HYDROCORTISONE ACETATE 25 MG RE SUPP: 25.0000 mg | Freq: Two times a day (BID) | RECTAL | Status: DC

## 2016-02-10 MED ORDER — FERROUS SULFATE 325 (65 FE) MG PO TABS
ORAL_TABLET | ORAL | Status: AC
  Administered 2016-02-10: 325 mg via ORAL
  Filled 2016-02-10: qty 1

## 2016-02-10 NOTE — ED Notes (Addendum)
Pt reports several episodes of BRBPR since last Thursday. Pt reports passing clots rectally over the last 2 days, as well as feeling dizzy and nauseous (denies emesis). Mayford Knife, MD at bedside at this time.

## 2016-02-10 NOTE — Discharge Instructions (Signed)

## 2016-02-10 NOTE — ED Provider Notes (Signed)
[facility] Emergency Department Provider Note     Time seen: ----------------------------------------- 8:09 PM on 02/10/2016 -----------------------------------------    I have reviewed the triage vital signs and the nursing notes.   HISTORY  Chief Complaint Rectal Bleeding    HPI Seth [name] is a 45 y.o. male who presents for several days of bright red blood per rectum since last Thursday. Patient states she's been passing clots rectally over the last 2 days he's feeling dizzy and nauseous. Patient states his history of cirrhosis, drinks about a 12 pack of beer a day. He has had rectal bleeding before, this was thought to be secondary to cirrhosis. Again bleeding his been for the last week, he last drank about an hour ago.   Past Medical History  Diagnosis Date  . Hypertension   . Seizures (HCC)   . Cirrhosis (HCC)   . Chronic back pain     Patient Active Problem List   Diagnosis Date Noted  . 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 Seizure disorder (HCC) 02/04/2013  . Hypertension 02/04/2013  . Hyponatremia 02/04/2013  . Hypokalemia 02/04/2013    History reviewed. No pertinent past surgical history.  Allergies Ciprofloxacin  Social History Social History  Substance Use Topics  . Smoking status: Current Every Day Smoker -- 0.25 packs/day for 25 years    Types: Cigarettes  . Smokeless tobacco: None  . Alcohol Use: 4.8 oz/week    8 Cans of beer per week    Review of Systems Constitutional: Negative for fever. Eyes: Negative for visual changes. ENT: Negative for sore throat. Cardiovascular: Negative for chest pain. Respiratory: Negative for shortness of breath. Gastrointestinal: Positive for abdominal pain and rectal bleeding Genitourinary: Negative for dysuria. Musculoskeletal: Negative for back pain. Skin: Negative for rash. Neurological: Negative for headaches, positive for weakness  10-point ROS  otherwise negative.  ____________________________________________   PHYSICAL EXAM:  VITAL SIGNS: ED Triage Vitals  Enc Vitals Group     BP 02/10/16 1848 152/72 mmHg     Pulse Rate 02/10/16 1848 96     Resp 02/10/16 1848 18     Temp 02/10/16 1848 98.4 F (36.9 C)     Temp Source 02/10/16 1848 Oral     SpO2 02/10/16 1848 97 %     Weight 02/10/16 1848 160 lb (72.576 kg)     Height 02/10/16 1848  (1.702 m)     Head Cir --      Peak Flow --      Pain Score 02/10/16 1849 5     Pain Loc --      Pain Edu? --      Excl. in GC? --     Constitutional: Alert and oriented. Well appearing and in no distress. Eyes: Conjunctivae are normal. PERRL. Normal extraocular movements. ENT   Head: Normocephalic and atraumatic.   Nose: No congestion/rhinnorhea.   Mouth/Throat: Mucous membranes are moist.   Neck: No stridor. Cardiovascular: Normal rate, regular rhythm. Normal and symmetric distal pulses are present in all extremities. No murmurs, rubs, or gallops. Respiratory: Normal respiratory effort without tachypnea nor retractions. Breath sounds are clear and equal bilaterally. No wheezes/rales/rhonchi. Gastrointestinal: Soft and nontender. No distention. No abdominal bruits.  Musculoskeletal: Nontender with normal range of motion in all extremities. No joint effusions.  No lower extremity tenderness nor edema. Neurologic:  Normal speech and language. No gross focal neurologic deficits are appreciated. Speech is normal. No gait instability. Skin:  Skin is warm,  dry and intact. No rash noted. Psychiatric: Mood and affect are normal. Speech and behavior are normal. Patient exhibits appropriate insight and judgment. ____________________________________________  EKG: Interpreted by me. Normal sinus rhythm with a rate of 95 bpm, normal PR interval, normal QRS, normal QT interval. Normal axis.   ____________________________________________  ED COURSE:  Pertinent labs & imaging  results that were available during my care of the patient were reviewed by me and considered in my medical decision making (see chart for details).  patient is no acute distress but does describe significant blood loss likely secondary to cirrhosis and chronic alcoholism. We'll check labs and reevaluate.  ____________________________________________    LABS (pertinent positives/negatives)  Labs Reviewed  COMPREHENSIVE METABOLIC PANEL - Abnormal; Notable for the following:    Glucose, Bld 109 (*)    BUN 5 (*)    AST 96 (*)    Alkaline Phosphatase 162 (*)    All other components within normal limits  CBC - Abnormal; Notable for the following:    RBC 4.21 (*)    Hemoglobin 8.9 (*)    HCT 29.4 (*)    MCV 69.8 (*)    MCH 21.2 (*)    MCHC 30.4 (*)    RDW 19.3 (*)    All other components within normal limits  LIPASE, BLOOD  POC OCCULT BLOOD, ED   ____________________________________________  FINAL ASSESSMENT AND PLAN   Rectal bleeding, cirrhosis, chronic alcoholism   Plan: Patient with labs as dictated above.  Patient with stable labs but passing large clots. Patient also heavily intoxicated. Will recommend observation. He will eventually need CIWA protocol.    Emily Filbert, MD   Emily Filbert, MD 02/10/16 [id number]

## 2016-02-10 NOTE — Consult Note (Signed)
[facility] [place] [facility]   PATIENT NAME: Seth [name]    MR#:  [id number]  DATE OF BIRTH:  10/04/1971  DATE OF ADMISSION:  02/10/2016  PRIMARY CARE PHYSICIAN: Rayetta Humphrey, MD   REQUESTING/REFERRING PHYSICIAN: Daryel November  CHIEF COMPLAINT:   Chief Complaint  Patient presents with  . Rectal Bleeding    HISTORY OF PRESENT ILLNESS:  [name]  is a 45 y.o. male patient presents with rectal bleeding going on for 5 days. He states that on Friday he went about 10 times and on Saturday about 4-5 times and this morning 4 times. His last bowel movement was 7 hours ago. He describes the stool as normal brown stool and then he does see some redness in the toilet but then when he wipes he has some blood clots. Blood clots or bright red blood. Looking back at old records he had a colonoscopy that was done last year in April that showed rectal varices and diverticulosis. The patient does not want to stay in the hospital.  PAST MEDICAL HISTORY:   Past Medical History  Diagnosis Date  . Hypertension   . Seizures (HCC)   . Cirrhosis (HCC)   . Chronic back pain   . Anemia     PAST SURGICAL HISTOIRY:   Past Surgical History  Procedure Laterality Date  . No past surgeries      SOCIAL HISTORY:   Social History  Substance Use Topics  . Smoking status: Current Every Day Smoker -- 0.25 packs/day for 25 years    Types: Cigarettes  . Smokeless tobacco: Not on file  . Alcohol Use: 50.4 oz/week    84 Cans of beer per week     Comment: Pt reports drinking a 12 pack of beer daily.    FAMILY HISTORY:   Family History  Problem Relation Age of Onset  . CAD Mother   . Hypertension Mother   . CAD Father   . COPD Father     DRUG ALLERGIES:   Allergies  Allergen Reactions  . Ciprofloxacin Itching, Anxiety and Rash    REVIEW OF SYSTEMS:  CONSTITUTIONAL: Some fever and sweats, positive for fatigue.  EYES: No blurred or double vision.  Patient states his vision can go in and out and he sees black spots EARS, NOSE, AND THROAT: No tinnitus or ear pain. Decreased hearing RESPIRATORY: Positive for cough and green phlegm, positive for shortness of breath, no wheezing or hemoptysis.  CARDIOVASCULAR: No chest pain, orthopnea, edema.  GASTROINTESTINAL: No nausea, vomiting, diarrhea or abdominal pain. Positive for bright red blood per rectum with clots GENITOURINARY: No dysuria, hematuria.  ENDOCRINE: No polyuria, nocturia,  HEMATOLOGY: No anemia, easy bruising or bleeding SKIN: Rash on face and upper back MUSCULOSKELETAL: No joint pain or arthritis.   NEUROLOGIC: No tingling, numbness, weakness.  PSYCHIATRY: History of anxiety - lots of stressors with family and he doesn't want to get into it with me.   MEDICATIONS AT HOME:   Prior to Admission medications   Medication Sig Start Date End Date Taking? Authorizing Provider  amLODipine (NORVASC) 10 MG tablet Take 10 mg by mouth daily.   Yes Historical Provider, MD  Aspirin-Salicylamide-Caffeine (BC HEADACHE POWDER PO) Take 2 packets by mouth 4 (four) times daily as needed (for headache/pain).   Yes Historical Provider, MD  diazepam (VALIUM) 5 MG tablet Take 5 mg by mouth 3 (three) times daily as needed for anxiety.    Yes Historical Provider, MD  gabapentin (NEURONTIN) 400 MG capsule Take 400 mg by mouth 3 (three) times daily.   Yes Historical Provider, MD  lacosamide (VIMPAT) 200 MG TABS tablet Take 200 mg by mouth 2 (two) times daily.   Yes Historical Provider, MD  Multiple Vitamin (MULTIVITAMIN WITH MINERALS) TABS tablet Take 1 tablet by mouth daily.   Yes Historical Provider, MD  nadolol (CORGARD) 20 MG tablet Take 20 mg by mouth every evening.   Yes Historical Provider, MD  omeprazole (PRILOSEC) 20 MG capsule Take 20 mg by mouth 2 (two) times daily before a meal.    Yes Historical Provider, MD  spironolactone (ALDACTONE) 25 MG tablet Take 25 mg by mouth daily.   Yes Historical  Provider, MD  ferrous sulfate 325 (65 FE) MG tablet Take 1 tablet (325 mg total) by mouth 2 (two) times daily with a meal. 02/11/16   Alford Highland, MD  hydrocortisone (ANUSOL-HC) 25 MG suppository Place 1 suppository (25 mg total) rectally 2 (two) times daily. 02/10/16   Alford Highland, MD  polyethylene glycol [facility] Ethelene Hal) packet Take 17 g by mouth daily as needed for moderate constipation. 02/11/16   Alford Highland, MD      VITAL SIGNS:  Blood pressure 156/89, pulse 96, temperature 98.4 F (36.9 C), temperature source Oral, resp. rate 16, height 5\' 7"  (1.702 m), weight 72.576 kg (160 lb), SpO2 97 %.  PHYSICAL EXAMINATION:  GENERAL:  45 y.o.-year-old patient lying in the bed with no acute distress.  EYES: Pupils equal, round, reactive to light and accommodation. No scleral icterus. Extraocular muscles intact.  HEENT: Head atraumatic, normocephalic. Oropharynx and nasopharynx clear. Tympanic membrane bilaterally blocked by wax. NECK:  Supple, no jugular venous distention. No thyroid enlargement, no tenderness.  LUNGS: Normal breath sounds bilaterally, no wheezing, rales,rhonchi or crepitation. No use of accessory muscles of respiration.  CARDIOVASCULAR: S1, S2 normal. No murmurs, rubs, or gallops.  ABDOMEN: Soft, nontender, nondistended. Bowel sounds present. No organomegaly or mass. Patient refused me doing a rectal exam. EXTREMITIES: No pedal edema, cyanosis, or clubbing.  NEUROLOGIC: Cranial nerves II through XII are intact. Muscle strength 5/5 in all extremities. Sensation intact. Gait not checked.  PSYCHIATRIC: The patient is alert and oriented x 3.  SKIN: No obvious rash, lesion, or ulcer.   LABORATORY PANEL:   CBC  Recent Labs Lab 02/10/16 1851  WBC 7.9  HGB 8.9*  HCT 29.4*  PLT 245   ------------------------------------------------------------------------------------------------------------------  Chemistries   Recent Labs Lab 02/10/16 1851  NA 139  K 4.2   CL 102  CO2 26  GLUCOSE 109*  BUN 5*  CREATININE 0.74  CALCIUM 9.0  AST 96*  ALT 42  ALKPHOS 162*  BILITOT 0.5   ------------------------------------------------------------------------------------------------------------------   EKG:   Normal sinus rhythm 95 bpm  IMPRESSION AND PLAN:   1. Rectal bleeding. Colonoscopy in April 2016 showed rectal varices and diverticulosis. This is likely hemorrhoidal bleeding. I prescribed Anusol suppositories and MiraLAX to avoid constipation. I advised him to get back to Dr. Marva Panda. The patient is not bleeding profusely since his hemoglobin is stable from where it was last month. Patient was told to stop BC powder and cut back tremendously on his alcohol. 2. Iron deficiency anemia- I prescribed ferrous sulfate. 3. History of seizure continue his usual medications 4. Hypertension continue usual medications 5. Alcohol abuse- patient also has history of cirrhosis. Follow-up with GI as outpatient 6. Tobacco abuse- smoking cessation counseling done 3 minutes by me 7. Wax bilateral ears can  use hydrogen peroxide to loosen that up  All the records are reviewed and case discussed with Consulting provider. Management plans discussed with the patient, family and they are in agreement.  CODE STATUS: Full code  TOTAL TIME TAKING CARE OF THIS PATIENT: 55 minutes minutes.    Alford Highland M.D on 02/10/2016 at 9:55 PM  Between 7am to 6pm - Pager - [phone]  After 6pm go to [email or web address] - password [email or web address] [facility]  [facility]  Office  [phone]  CC: Primary care Physician: Rayetta Humphrey, MD

## 2016-02-10 NOTE — ED Notes (Signed)
States rectal bleeding for the past week, states this has happened before, states hx of liver cirhosis, state he last drank about 1 hour ago, states dizziness and abd pain, states large blood clots in his stool

## 2016-03-10 ENCOUNTER — Other Ambulatory Visit
Admission: RE | Admit: 2016-03-10 | Discharge: 2016-03-10 | Disposition: A | Discharge status: Home / Self Care | Payer: Medicaid Other | Source: Ambulatory Visit | Attending: Gastroenterology | Admitting: Gastroenterology

## 2016-03-10 DIAGNOSIS — K7030 Alcoholic cirrhosis of liver without ascites: Secondary | ICD-10-CM | POA: Diagnosis present

## 2016-03-10 LAB — AMMONIA: AMMONIA: 50 umol/L — AB (ref 9–35)

## 2016-04-21 ENCOUNTER — Other Ambulatory Visit: Payer: Self-pay | Admitting: Gastroenterology

## 2016-04-21 DIAGNOSIS — K7030 Alcoholic cirrhosis of liver without ascites: Secondary | ICD-10-CM

## 2016-04-27 ENCOUNTER — Ambulatory Visit
Admission: RE | Admit: 2016-04-27 | Discharge: 2016-04-27 | Disposition: A | Discharge status: Home / Self Care | Payer: Medicaid Other | Source: Ambulatory Visit | Attending: Gastroenterology | Admitting: Gastroenterology

## 2016-04-27 DIAGNOSIS — N281 Cyst of kidney, acquired: Secondary | ICD-10-CM | POA: Insufficient documentation

## 2016-04-27 DIAGNOSIS — R188 Other ascites: Secondary | ICD-10-CM | POA: Insufficient documentation

## 2016-04-27 DIAGNOSIS — K7030 Alcoholic cirrhosis of liver without ascites: Secondary | ICD-10-CM | POA: Insufficient documentation

## 2016-07-01 ENCOUNTER — Inpatient Hospital Stay
Admission: EM | Admit: 2016-07-01 | Discharge: 2016-07-14 | DRG: 082 | Disposition: E | Payer: Medicaid Other | Attending: Pulmonary Disease | Admitting: Pulmonary Disease

## 2016-07-01 ENCOUNTER — Emergency Department: Payer: Medicaid Other

## 2016-07-01 DIAGNOSIS — G8929 Other chronic pain: Secondary | ICD-10-CM | POA: Diagnosis present

## 2016-07-01 DIAGNOSIS — I10 Essential (primary) hypertension: Secondary | ICD-10-CM | POA: Diagnosis present

## 2016-07-01 DIAGNOSIS — R945 Abnormal results of liver function studies: Secondary | ICD-10-CM

## 2016-07-01 DIAGNOSIS — Z8249 Family history of ischemic heart disease and other diseases of the circulatory system: Secondary | ICD-10-CM

## 2016-07-01 DIAGNOSIS — Z825 Family history of asthma and other chronic lower respiratory diseases: Secondary | ICD-10-CM | POA: Diagnosis not present

## 2016-07-01 DIAGNOSIS — K7460 Unspecified cirrhosis of liver: Secondary | ICD-10-CM | POA: Diagnosis present

## 2016-07-01 DIAGNOSIS — M549 Dorsalgia, unspecified: Secondary | ICD-10-CM | POA: Diagnosis present

## 2016-07-01 DIAGNOSIS — R7989 Other specified abnormal findings of blood chemistry: Secondary | ICD-10-CM

## 2016-07-01 DIAGNOSIS — S069X4A Unspecified intracranial injury with loss of consciousness of 6 hours to 24 hours, initial encounter: Secondary | ICD-10-CM

## 2016-07-01 DIAGNOSIS — R34 Anuria and oliguria: Secondary | ICD-10-CM | POA: Diagnosis present

## 2016-07-01 DIAGNOSIS — I469 Cardiac arrest, cause unspecified: Secondary | ICD-10-CM

## 2016-07-01 DIAGNOSIS — Z79899 Other long term (current) drug therapy: Secondary | ICD-10-CM

## 2016-07-01 DIAGNOSIS — Z66 Do not resuscitate: Secondary | ICD-10-CM | POA: Diagnosis present

## 2016-07-01 DIAGNOSIS — G9382 Brain death: Secondary | ICD-10-CM | POA: Diagnosis present

## 2016-07-01 DIAGNOSIS — S061X9A Traumatic cerebral edema with loss of consciousness of unspecified duration, initial encounter: Secondary | ICD-10-CM | POA: Diagnosis present

## 2016-07-01 DIAGNOSIS — D649 Anemia, unspecified: Secondary | ICD-10-CM

## 2016-07-01 DIAGNOSIS — J9601 Acute respiratory failure with hypoxia: Secondary | ICD-10-CM | POA: Diagnosis present

## 2016-07-01 DIAGNOSIS — R579 Shock, unspecified: Secondary | ICD-10-CM | POA: Diagnosis present

## 2016-07-01 DIAGNOSIS — S065X9A Traumatic subdural hemorrhage with loss of consciousness of unspecified duration, initial encounter: Principal | ICD-10-CM | POA: Diagnosis present

## 2016-07-01 DIAGNOSIS — F17210 Nicotine dependence, cigarettes, uncomplicated: Secondary | ICD-10-CM | POA: Diagnosis present

## 2016-07-01 DIAGNOSIS — G936 Cerebral edema: Secondary | ICD-10-CM

## 2016-07-01 DIAGNOSIS — S06369A Traumatic hemorrhage of cerebrum, unspecified, with loss of consciousness of unspecified duration, initial encounter: Secondary | ICD-10-CM | POA: Diagnosis present

## 2016-07-01 DIAGNOSIS — I6200 Nontraumatic subdural hemorrhage, unspecified: Secondary | ICD-10-CM

## 2016-07-01 DIAGNOSIS — S069XAA Unspecified intracranial injury with loss of consciousness status unknown, initial encounter: Secondary | ICD-10-CM | POA: Diagnosis present

## 2016-07-01 DIAGNOSIS — S069X9A Unspecified intracranial injury with loss of consciousness of unspecified duration, initial encounter: Secondary | ICD-10-CM | POA: Diagnosis present

## 2016-07-01 DIAGNOSIS — S065XAA Traumatic subdural hemorrhage with loss of consciousness status unknown, initial encounter: Secondary | ICD-10-CM

## 2016-07-01 LAB — COMPREHENSIVE METABOLIC PANEL
ALBUMIN: 3.4 g/dL — AB (ref 3.5–5.0)
ALT: 47 U/L (ref 17–63)
ANION GAP: 20 — AB (ref 5–15)
AST: 164 U/L — AB (ref 15–41)
Alkaline Phosphatase: 199 U/L — ABNORMAL HIGH (ref 38–126)
BUN: <5 mg/dL — ABNORMAL LOW (ref 6–20)
CO2: 21 mmol/L — AB (ref 22–32)
Calcium: 8.8 mg/dL — ABNORMAL LOW (ref 8.9–10.3)
Chloride: 105 mmol/L (ref 101–111)
Creatinine, Ser: 1.15 mg/dL (ref 0.61–1.24)
GFR calc Af Amer: >60 mL/min (ref >60)
GFR calc non Af Amer: >60 mL/min (ref >60)
GLUCOSE: 252 mg/dL — AB (ref 65–99)
POTASSIUM: 4 mmol/L (ref 3.5–5.1)
SODIUM: 146 mmol/L — AB (ref 135–145)
Total Bilirubin: 0.4 mg/dL (ref 0.3–1.2)
Total Protein: 8 g/dL (ref 6.5–8.1)

## 2016-07-01 LAB — TROPONIN I: Troponin I: 0.03 ng/mL (ref <0.03)

## 2016-07-01 LAB — BLOOD GAS, ARTERIAL
ACID-BASE DEFICIT: 18.9 mmol/L — AB (ref 0.0–2.0)
Allens test (pass/fail): POSITIVE — AB
BICARBONATE: 12.2 meq/L — AB (ref 21.0–28.0)
FIO2: 1
MECHVT: 500 mL
Mechanical Rate: 20
O2 SAT: 99.7 %
PATIENT TEMPERATURE: 37
PEEP/CPAP: 5 cmH2O
PH ART: 6.93 — AB (ref 7.350–7.450)
PO2 ART: 265 mmHg — AB (ref 83.0–108.0)
RATE: 20 resp/min
pCO2 arterial: 58 mmHg — ABNORMAL HIGH (ref 32.0–48.0)

## 2016-07-01 LAB — URINE DRUG SCREEN, QUALITATIVE (ARMC ONLY)
AMPHETAMINES, UR SCREEN: NOT DETECTED
BARBITURATES, UR SCREEN: NOT DETECTED
Benzodiazepine, Ur Scrn: POSITIVE — AB
COCAINE METABOLITE, UR ~~LOC~~: NOT DETECTED
Cannabinoid 50 Ng, Ur ~~LOC~~: NOT DETECTED
MDMA (ECSTASY) UR SCREEN: NOT DETECTED
METHADONE SCREEN, URINE: NOT DETECTED
Opiate, Ur Screen: NOT DETECTED
Phencyclidine (PCP) Ur S: NOT DETECTED
TRICYCLIC, UR SCREEN: POSITIVE — AB

## 2016-07-01 LAB — CBC
HCT: 30 % — ABNORMAL LOW (ref 40.0–52.0)
HEMOGLOBIN: 8.8 g/dL — AB (ref 13.0–18.0)
MCH: 24.6 pg — ABNORMAL LOW (ref 26.0–34.0)
MCHC: 29.2 g/dL — AB (ref 32.0–36.0)
MCV: 84.3 fL (ref 80.0–100.0)
Platelets: 298 10*3/uL (ref 150–440)
RBC: 3.56 MIL/uL — ABNORMAL LOW (ref 4.40–5.90)
RDW: 20.7 % — AB (ref 11.5–14.5)
WBC: 8.5 10*3/uL (ref 3.8–10.6)

## 2016-07-01 LAB — ETHANOL: Alcohol, Ethyl (B): 167 mg/dL — ABNORMAL HIGH (ref <5)

## 2016-07-01 LAB — LACTIC ACID, PLASMA: Lactic Acid, Venous: 13.6 mmol/L (ref 0.5–1.9)

## 2016-07-01 LAB — MRSA PCR SCREENING: MRSA by PCR: NEGATIVE

## 2016-07-01 LAB — GLUCOSE, CAPILLARY: Glucose-Capillary: 84 mg/dL (ref 65–99)

## 2016-07-01 MED ORDER — NOREPINEPHRINE BITARTRATE 1 MG/ML IV SOLN: 2.0000 ug/min | INTRAVENOUS | Status: DC

## 2016-07-01 MED ORDER — SODIUM CHLORIDE 0.9 % IV SOLN
INTRAVENOUS | Status: DC
  Administered 2016-07-01: 10:00:00 via INTRAVENOUS

## 2016-07-01 MED ORDER — DOPAMINE-DEXTROSE 3.2-5 MG/ML-% IV SOLN
INTRAVENOUS | Status: AC
  Filled 2016-07-01: qty 250

## 2016-07-01 MED ORDER — DOPAMINE-DEXTROSE 3.2-5 MG/ML-% IV SOLN
INTRAVENOUS | Status: AC | PRN
  Administered 2016-07-01: 15 ug/kg/min via INTRAVENOUS

## 2016-07-01 MED ORDER — SODIUM CHLORIDE 0.9 % IV SOLN
INTRAVENOUS | Status: AC | PRN
  Administered 2016-07-01 (×2): 1000 mL via INTRAVENOUS

## 2016-07-01 MED ORDER — SODIUM CHLORIDE 0.9 % IV BOLUS (SEPSIS)
1000.0000 mL | Freq: Once | INTRAVENOUS | Status: AC
  Administered 2016-07-01: 1000 mL via INTRAVENOUS

## 2016-07-01 MED ORDER — FAMOTIDINE IN NACL 20-0.9 MG/50ML-% IV SOLN
20.0000 mg | Freq: Two times a day (BID) | INTRAVENOUS | Status: DC
  Administered 2016-07-01: 20 mg via INTRAVENOUS
  Filled 2016-07-01 (×2): qty 50

## 2016-07-01 MED ORDER — NOREPINEPHRINE 4 MG/250ML-% IV SOLN
INTRAVENOUS | Status: AC
  Administered 2016-07-01: 4 mg
  Filled 2016-07-01: qty 250

## 2016-07-01 MED ORDER — NOREPINEPHRINE 4 MG/250ML-% IV SOLN
0.0000 ug/min | INTRAVENOUS | Status: DC
  Filled 2016-07-01: qty 250

## 2016-07-01 MED ORDER — SODIUM CHLORIDE 0.9 % IV SOLN: 250.0000 mL | INTRAVENOUS | Status: DC | PRN

## 2016-07-01 MED ORDER — SODIUM CHLORIDE 0.9 % IV BOLUS (SEPSIS)
2000.0000 mL | Freq: Once | INTRAVENOUS | Status: AC
  Administered 2016-07-01: 2000 mL via INTRAVENOUS

## 2016-07-01 MED ORDER — CHLORHEXIDINE GLUCONATE 0.12% ORAL RINSE (MEDLINE KIT)
15.0000 mL | Freq: Two times a day (BID) | OROMUCOSAL | Status: DC
Start: 2016-07-01 — End: 2016-07-01
  Administered 2016-07-01: 15 mL via OROMUCOSAL
  Filled 2016-07-01 (×2): qty 15

## 2016-07-01 MED ORDER — ANTISEPTIC ORAL RINSE SOLUTION (CORINZ)
7.0000 mL | OROMUCOSAL | Status: DC
  Administered 2016-07-01: 7 mL via OROMUCOSAL
  Filled 2016-07-01 (×6): qty 7

## 2016-07-05 MED FILL — Medication: Qty: 1 | Status: AC

## 2016-07-11 MED ORDER — IPRATROPIUM-ALBUTEROL 0.5-2.5 (3) MG/3ML IN SOLN
RESPIRATORY_TRACT | Status: AC
Start: 2016-07-11 — End: 2016-07-11
  Filled 2016-07-11: qty 3

## 2016-07-14 NOTE — ED Notes (Signed)
MD Manson PasseyBrown spoke with mother and girlfriend of pt to update on pt condition - Chaplain with family also in the family room

## 2016-07-14 NOTE — Progress Notes (Signed)
   09-Nov-2016 0930  Clinical Encounter Type  Visited With Patient and family together  Visit Type Patient actively dying  Referral From Nurse  Consult/Referral To Chaplain  Spiritual Encounters  Spiritual Needs Emotional;Grief support;Prayer  Stress Factors  Patient Stress Factors Loss  Family Stress Factors Loss;Major life changes  Provided end-of-life spiritual care to patient, pastoral care and grief support to family.  Chap. Shellie Goettl G. Braleigh Massoud, ext. [phone]

## 2016-07-14 NOTE — ED Notes (Signed)
Mom, girlfriend, chaplin, Dr. Manson PasseyBrown at bedside.

## 2016-07-14 NOTE — ED Notes (Signed)
Foley cath inserted without difficulty.
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
## 2016-07-14 NOTE — ED Notes (Signed)
Detective Danny with [place] [employer] in pt room taking photographs of pt injuries/lacerations and bruises
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
## 2016-07-14 NOTE — ED Notes (Signed)
Report given to oncoming nurse Bill RN

## 2016-07-14 NOTE — ED Notes (Signed)
Lab called with critical lactic of 13.6 and troponin of 0.03 - Dr Manson PasseyBrown notified
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
## 2016-07-14 NOTE — ED Notes (Signed)
Pt has lac to back of right lower side of head that continues to bleed - noted dried blood throughout hair with the small lac still bleeding - MD assessed and applied approximately 5-6 staples at this time

## 2016-07-14 NOTE — ED Notes (Signed)
Bear hugger placed - no pupil reaction and no corneal reflex

## 2016-07-14 NOTE — Progress Notes (Signed)
Patient was brought from ED- started on levophed and maxed out on dosage infusing at 50mcqs- per Dr. Sung AmabileSimonds to not increase the medication.  Patient oxygen saturation declined rapidly while on 100% on the vent and BP dropped rapidly despite levophed infusion.  Family at bedside- CDS here and aware of the situation with patient and updated vitals.  Patient unresponsive and not breathing over the vent- no sedation.  Patient eventually passed away and time of death called at 12:06.  Dr. Sung AmabileSimonds notified and CDS here and aware.  Waiting on call back from CDS about potential donor. Spoke to Police officer Bess Kindshris Benny- he stated that patient would be an autopsy case and [facility] and the MD involved in the case name is Dr. Thayer Jewanker.

## 2016-07-14 NOTE — ED Notes (Signed)
Bud from [employer] called to get info. Said that someone from [employer] would come here. No time frame given.
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
## 2016-07-14 DEATH — deceased
